# Patient Record
Sex: Female | Born: 1960 | Race: Black or African American | Hispanic: No | Marital: Single | Smoking: Current every day smoker
Health system: Southern US, Community
[De-identification: ages and names within clinical notes are randomized; demographics above are authoritative.]

## PROBLEM LIST (undated history)

## (undated) DIAGNOSIS — K2990 Gastroduodenitis, unspecified, without bleeding: Secondary | ICD-10-CM

## (undated) DIAGNOSIS — E119 Type 2 diabetes mellitus without complications: Secondary | ICD-10-CM

## (undated) DIAGNOSIS — J45909 Unspecified asthma, uncomplicated: Secondary | ICD-10-CM

## (undated) DIAGNOSIS — I10 Essential (primary) hypertension: Secondary | ICD-10-CM

## (undated) DIAGNOSIS — K648 Other hemorrhoids: Secondary | ICD-10-CM

## (undated) DIAGNOSIS — K219 Gastro-esophageal reflux disease without esophagitis: Secondary | ICD-10-CM

## (undated) HISTORY — DX: Gastro-esophageal reflux disease without esophagitis: K21.9

## (undated) HISTORY — PX: EYE SURGERY: SHX253

## (undated) HISTORY — PX: MULTIPLE TOOTH EXTRACTIONS: SHX2053

## (undated) HISTORY — DX: Other hemorrhoids: K64.8

## (undated) HISTORY — DX: Unspecified asthma, uncomplicated: J45.909

## (undated) HISTORY — DX: Gastroduodenitis, unspecified, without bleeding: K29.90

## (undated) HISTORY — DX: Essential (primary) hypertension: I10

## (undated) HISTORY — DX: Type 2 diabetes mellitus without complications: E11.9

---

## 2004-10-20 HISTORY — PX: ESOPHAGOGASTRODUODENOSCOPY: SHX1529

## 2005-05-14 ENCOUNTER — Ambulatory Visit (HOSPITAL_COMMUNITY): Admission: RE | Admit: 2005-05-14 | Discharge: 2005-05-14 | Payer: Self-pay | Admitting: Obstetrics & Gynecology

## 2005-10-20 HISTORY — PX: COLONOSCOPY: SHX174

## 2013-07-25 ENCOUNTER — Encounter: Payer: Self-pay | Admitting: Family Medicine

## 2015-03-20 ENCOUNTER — Encounter: Payer: Self-pay | Admitting: Gastroenterology

## 2015-04-09 ENCOUNTER — Ambulatory Visit: Payer: Self-pay | Admitting: Gastroenterology

## 2015-04-11 ENCOUNTER — Encounter: Payer: Self-pay | Admitting: Gastroenterology

## 2015-04-11 ENCOUNTER — Ambulatory Visit (INDEPENDENT_AMBULATORY_CARE_PROVIDER_SITE_OTHER): Payer: Medicaid Other | Admitting: Gastroenterology

## 2015-04-11 VITALS — BP 121/88 | HR 96 | Temp 97.1°F | Ht 59.0 in | Wt 144.2 lb

## 2015-04-11 DIAGNOSIS — K219 Gastro-esophageal reflux disease without esophagitis: Secondary | ICD-10-CM | POA: Diagnosis not present

## 2015-04-11 DIAGNOSIS — Z1211 Encounter for screening for malignant neoplasm of colon: Secondary | ICD-10-CM | POA: Diagnosis not present

## 2015-04-11 MED ORDER — ONDANSETRON HCL 4 MG PO TABS: 4.0000 mg | ORAL_TABLET | Freq: Three times a day (TID) | ORAL | Status: DC | PRN

## 2015-04-11 MED ORDER — PANTOPRAZOLE SODIUM 40 MG PO TBEC: 40.0000 mg | DELAYED_RELEASE_TABLET | Freq: Every day | ORAL | Status: DC

## 2015-04-11 NOTE — Patient Instructions (Addendum)
Please complete the stool sample and return to our office.   Stop Prilosec. Start Protonix once each morning, 30 minutes before breakfast. I sent this to the pharmacy.  For nausea: take Zofran as needed every 8 hours.   I would like to see you back in 2-4 weeks. You may not need a colonoscopy. You may just need an upper endoscopy if you keep having nausea, vomiting, and reflux despite changing up the medications and your diet. You may need both a colonoscopy and upper endoscopy. We will decide this at your next appointment in a few weeks.

## 2015-04-11 NOTE — Progress Notes (Signed)
Primary Care Physician:  Melynda Keller, NP Primary Gastroenterologist:  Dr. Darrick Penna   Chief Complaint  Patient presents with  . Gastrophageal Reflux  . set up tcs    HPI:   Jessica Kane is a 54 y.o. female presenting today at the request of Smith-Overman, Patty, NP secondary to nausea, vomiting, and possible updated colonoscopy.   Colonoscopy performed in  2007 by Dr. Allena Katz with mild diverticulosis. EGD in 2006 with mild to moderate diffuse gastritis, mild duodenitis.  No blood in stool. Nausea intermittent. Worse with eating. Occasional vomiting. Twice a week. Some mild lower abdominal discomfort, doesn't know if related to BMs or not. No dysphagia. Has significant reflux. Nocturnal reflux. Prilosec once daily, Zantac at night.    Poor historian. Likes fried foods. No NSAIDs.   Past Medical History  Diagnosis Date  . Diabetes   . Hypertension   . GERD (gastroesophageal reflux disease)   . Asthma     Past Surgical History  Procedure Laterality Date  . Colonoscopy  2007    Dr. Allena Katz: mild diverticulosis  . Esophagogastroduodenoscopy  2006    Dr. Allena Katz: mild to moderate diffuse gastritis, mild duodenitis  . Cesarean section      Current Outpatient Prescriptions  Medication Sig Dispense Refill  . Albuterol Sulfate (PROAIR HFA IN) Inhale into the lungs.    Marland Kitchen amLODipine (NORVASC) 5 MG tablet Take 5 mg by mouth daily.    . beclomethasone (QVAR) 80 MCG/ACT inhaler Inhale 2 puffs into the lungs 2 (two) times daily.    . metFORMIN (GLUCOPHAGE) 1000 MG tablet Take 1,000 mg by mouth daily.    Marland Kitchen omeprazole (PRILOSEC) 20 MG capsule Take 20 mg by mouth daily.    . ranitidine (ZANTAC) 150 MG tablet Take 150 mg by mouth at bedtime.    . sucralfate (CARAFATE) 1 G tablet Take 1 g by mouth 4 (four) times daily -  with meals and at bedtime.    . ondansetron (ZOFRAN) 4 MG tablet Take 1 tablet (4 mg total) by mouth every 8 (eight) hours as needed for nausea or vomiting. 30 tablet  1  . pantoprazole (PROTONIX) 40 MG tablet Take 1 tablet (40 mg total) by mouth daily. 30 minutes before breakfast. 30 tablet 3   No current facility-administered medications for this visit.    Allergies as of 04/11/2015  . (No Known Allergies)    Family History  Problem Relation Age of Onset  . Colon cancer Neg Hx     History   Social History  . Marital Status: Single    Spouse Name: N/A  . Number of Children: N/A  . Years of Education: N/A   Occupational History  . Not on file.   Social History Main Topics  . Smoking status: Current Some Day Smoker -- 1.00 packs/day    Types: Cigarettes  . Smokeless tobacco: Not on file  . Alcohol Use: No  . Drug Use: No  . Sexual Activity: Not on file   Other Topics Concern  . Not on file   Social History Narrative    Review of Systems: Gen: Denies any fever, chills, fatigue, weight loss, lack of appetite.  CV: +palpitations Resp: +DOE GI: see HPI GU : Denies urinary burning, urinary frequency, urinary hesitancy MS: Denies joint pain, muscle weakness, cramps, or limitation of movement.  Derm: Denies rash, itching, dry skin Psych: +anxiety  Heme: Denies bruising, bleeding, and enlarged lymph nodes.  Physical Exam: BP 121/88  mmHg  Pulse 96  Temp(Src) 97.1 F (36.2 C)  Ht  (1.499 m)  Wt 144 lb 3.2 oz (65.409 kg)  BMI 29.11 kg/m2 General:   Alert and oriented. Pleasant and cooperative. Well-nourished and well-developed.  Head:  Normocephalic and atraumatic. Eyes:  Without icterus, sclera clear and conjunctiva pink.  Ears:  Normal auditory acuity. Lungs:  Clear to auscultation bilaterally. No wheezes, rales, or rhonchi. No distress.  Heart:  S1, S2 present without murmurs appreciated.  Abdomen:  +BS, soft, mild TTP epigastric and lower abdomen and non-distended. No HSM noted. No guarding or rebound. No masses appreciated.  Rectal:  Deferred  Msk:  Symmetrical without gross deformities. Normal  posture. Extremities:  Without clubbing or edema. Neurologic:  Alert and  oriented x4;  grossly normal neurologically. Psych:  Alert and cooperative. Normal mood and affect.

## 2015-04-14 ENCOUNTER — Encounter: Payer: Self-pay | Admitting: Gastroenterology

## 2015-04-14 DIAGNOSIS — Z1211 Encounter for screening for malignant neoplasm of colon: Secondary | ICD-10-CM | POA: Insufficient documentation

## 2015-04-14 DIAGNOSIS — K219 Gastro-esophageal reflux disease without esophagitis: Secondary | ICD-10-CM | POA: Insufficient documentation

## 2015-04-14 NOTE — Assessment & Plan Note (Signed)
Referred for screening colonoscopy, with last colonoscopy in 2007 by Dr. Allena Katz. No known history of polyps or family history of colon cancer. Denies any concerning lower GI symptoms. Check ifobt. IF positive, proceed with colonoscopy. If negative, would recommend routine screening in 2017. Return in 2-4 weeks.

## 2015-04-14 NOTE — Assessment & Plan Note (Signed)
54 year old female, poor historian, with uncontrolled GERD symptoms despite Prilosec once daily and Zantac in the evening. Likely combination of dietary and behaviors. Associated nausea, occasional vomiting, could be secondary to uncontrolled GERD+/- delayed gastric emptying. EGD in 2006 with gastritis and duodenitis.   Will change Prilosec to Protonix, add Zofran prn, and return in 2-4 weeks. May ultimately need EGD +/- gastric emptying study if persistent issues.

## 2015-04-16 ENCOUNTER — Ambulatory Visit (INDEPENDENT_AMBULATORY_CARE_PROVIDER_SITE_OTHER): Payer: Medicaid Other

## 2015-04-16 DIAGNOSIS — Z1211 Encounter for screening for malignant neoplasm of colon: Secondary | ICD-10-CM

## 2015-04-16 NOTE — Progress Notes (Signed)
Pt returned one iFOBT and it was positive.  

## 2015-04-16 NOTE — Progress Notes (Signed)
cc'd to pcp 

## 2015-05-03 ENCOUNTER — Ambulatory Visit: Payer: Medicaid Other | Admitting: Gastroenterology

## 2015-05-03 NOTE — Progress Notes (Signed)
Pt has appt here on 05/14/2015 with Tobi BastosAnna.

## 2015-05-03 NOTE — Addendum Note (Signed)
Addended by: Nira RetortSAMS, Siona Coulston W on: 05/03/2015 11:56 AM   Modules accepted: Level of Service

## 2015-05-03 NOTE — Progress Notes (Signed)
Noted. Will discuss colonoscopy +/- EGD at time of colonoscopy.

## 2015-05-03 NOTE — Progress Notes (Signed)
//  PT has appt here on with Tobi BastosAnna.

## 2015-05-14 ENCOUNTER — Ambulatory Visit (INDEPENDENT_AMBULATORY_CARE_PROVIDER_SITE_OTHER): Payer: Medicaid Other | Admitting: Gastroenterology

## 2015-05-14 ENCOUNTER — Encounter: Payer: Self-pay | Admitting: Gastroenterology

## 2015-05-14 VITALS — BP 136/80 | HR 88 | Temp 97.6°F | Ht 62.0 in | Wt 145.2 lb

## 2015-05-14 DIAGNOSIS — R195 Other fecal abnormalities: Secondary | ICD-10-CM | POA: Diagnosis not present

## 2015-05-14 DIAGNOSIS — K219 Gastro-esophageal reflux disease without esophagitis: Secondary | ICD-10-CM | POA: Diagnosis not present

## 2015-05-14 NOTE — Patient Instructions (Signed)
We have scheduled you for a colonoscopy and upper endoscopy with Dr. Darrick Penna in the near future.   Continue Protonix once daily and Zofran as needed.

## 2015-05-14 NOTE — Assessment & Plan Note (Signed)
Without overt GI bleeding. Likely benign source. However, last colonoscopy in 2007.   Proceed with colonoscopy with Dr. Darrick Penna in the near future. The risks, benefits, and alternatives have been discussed in detail with the patient. They state understanding and desire to proceed.  Propofol

## 2015-05-14 NOTE — Progress Notes (Signed)
Referring Provider: Melynda Keller, NP Primary GI: Dr. Darrick Penna   Chief Complaint  Patient presents with  . Follow-up    HPI:   Jessica Kane is a 54 y.o. female presenting today with a history of gastritis, last EGD in 2006. Last colonoscopy in 2007 by Dr. Allena Katz with mild diverticulosis. Seen in late June by myself with reports of intermittent nausea, worsened with eating. Occasional vomiting. Significant reflux/nocturnal reflux. Changed from Prilosec to Protonix, added Zofran, with consideration for EGD +/- gastric emptying study if persistent issues. Ifobt also provided: heme positive.   Here to discuss screening colonoscopy +/- EGD. Taking Protonix once daily and Zofran before meals. Still complains of nausea. Vomits twice a week. Last time was after eating pizza. Worse with fried/fatty foods. Lower abdominal pain, vague, noted at night. Not better with defecation. Denies constipation or diarrhea. Persistent nocturnal reflux.   Past Medical History  Diagnosis Date  . Diabetes   . Hypertension   . GERD (gastroesophageal reflux disease)   . Asthma     Past Surgical History  Procedure Laterality Date  . Colonoscopy  2007    Dr. Allena Katz: mild diverticulosis  . Esophagogastroduodenoscopy  2006    Dr. Allena Katz: mild to moderate diffuse gastritis, mild duodenitis  . Cesarean section      Current Outpatient Prescriptions  Medication Sig Dispense Refill  . Albuterol Sulfate (PROAIR HFA IN) Inhale into the lungs.    Marland Kitchen amLODipine (NORVASC) 5 MG tablet Take 5 mg by mouth daily.    . beclomethasone (QVAR) 80 MCG/ACT inhaler Inhale 2 puffs into the lungs 2 (two) times daily.    . meclizine (ANTIVERT) 25 MG tablet Take 25 mg by mouth 3 (three) times daily as needed for dizziness.    . metFORMIN (GLUCOPHAGE) 1000 MG tablet Take 1,000 mg by mouth daily.    . ondansetron (ZOFRAN) 4 MG tablet Take 1 tablet (4 mg total) by mouth every 8 (eight) hours as needed for nausea or vomiting. 30  tablet 1  . pantoprazole (PROTONIX) 40 MG tablet Take 1 tablet (40 mg total) by mouth daily. 30 minutes before breakfast. 30 tablet 3  . ranitidine (ZANTAC) 150 MG tablet Take 150 mg by mouth at bedtime.     No current facility-administered medications for this visit.    Allergies as of 05/14/2015  . (No Known Allergies)    Family History  Problem Relation Age of Onset  . Colon cancer Neg Hx     History   Social History  . Marital Status: Single    Spouse Name: N/A  . Number of Children: N/A  . Years of Education: N/A   Social History Main Topics  . Smoking status: Current Some Day Smoker -- 1.00 packs/day    Types: Cigarettes  . Smokeless tobacco: Not on file  . Alcohol Use: No  . Drug Use: No  . Sexual Activity: Not on file   Other Topics Concern  . None   Social History Narrative    Review of Systems: Gen: see HPI CV: rare chest discomfort  Resp: +DOE GI: see HPI Derm: Denies rash, itching, dry skin Psych: Denies depression, anxiety, memory loss, confusion. No homicidal or suicidal ideation.  Heme: Denies bruising, bleeding, and enlarged lymph nodes.  Physical Exam: BP 136/80 mmHg  Pulse 88  Temp(Src) 97.6 F (36.4 C) (Oral)  Ht  (1.575 m)  Wt 145 lb 3.2 oz (65.862 kg)  BMI 26.55 kg/m2 General:   Alert  and oriented. No distress noted. Difficult to maintain eye contact. Fidgety Head:  Normocephalic and atraumatic. Eyes:  Conjuctiva clear without scleral icterus. Mouth:  Oral mucosa pink and moist.  Heart:  S1, S2 present without murmurs, rubs, or gallops. Regular rate and rhythm. Lungs: scattered rhonchi Abdomen:  +BS, soft, mild TTP lower adbomen and non-distended. No rebound or guarding. No HSM or masses noted. Msk:  Symmetrical without gross deformities. Normal posture. Extremities:  Without edema. Neurologic:  Alert and  oriented x4;  grossly normal neurologically. Skin:  Intact without significant lesions or rashes. Psych:  Alert and  cooperative.

## 2015-05-14 NOTE — Progress Notes (Signed)
REVIEWED-NO ADDITIONAL RECOMMENDATIONS. 

## 2015-05-14 NOTE — Assessment & Plan Note (Signed)
54 year old female, poor historian, with uncontrolled GERD symptoms despite changing from Prilosec to Protonix. Associated nausea and occasional vomiting several times a week. Highly suspect dietary and behavior-related. Unable to exclude delayed gastric emptying. With persistent symptoms and last EGD in 2006, will proceed with EGD at time of colonoscopy.   Proceed with upper endoscopy in the near future with Dr. Darrick Penna. The risks, benefits, and alternatives have been discussed in detail with patient. They have stated understanding and desire to proceed.  Propofol recommended

## 2015-05-15 ENCOUNTER — Other Ambulatory Visit: Payer: Self-pay

## 2015-05-15 MED ORDER — PHOSPHATE LAXATIVE 2.7-7.2 GM/15ML PO SOLN: 15.0000 mL | Freq: Once | ORAL | Status: DC

## 2015-05-15 MED ORDER — PEG-KCL-NACL-NASULF-NA ASC-C 100 G PO SOLR
1.0000 | Freq: Once | ORAL | Status: DC
Start: 2015-05-15 — End: 2015-06-05

## 2015-05-22 NOTE — Progress Notes (Signed)
CC'ED TO PCP 

## 2015-05-29 NOTE — Patient Instructions (Addendum)
Charmain Diosdado Tetreault  05/29/2015     @PREFPERIOPPHARMACY @   Your procedure is scheduled on  06/04/2015  Report to [facility] at  930  A.M.  Call this number if you have problems the morning of surgery:  [phone]   Remember:  Do not eat food or drink liquids after midnight.  Take these medicines the morning of surgery with A SIP OF WATER  Amlodipine, antivert, zofran, protonix.  Use your inhaler    Do not wear jewelry, make-up or nail polish.  Do not wear lotions, powders, or perfumes.    Do not shave 48 hours prior to surgery.  Men may shave face and neck.  Do not bring valuables to the hospital.  [facility] is not responsible for any belongings or valuables.  Contacts, dentures or bridgework may not be worn into surgery.  Leave your suitcase in the car.  After surgery it may be brought to your room.  For patients admitted to the hospital, discharge time will be determined by your treatment team.  Patients discharged the day of surgery will not be allowed to drive home.   Name and phone number of your driver:   family Special instructions:  Follow the prep and diet instructions given to you by Dr. Darrick Penna office.  Please read over the following fact sheets that you were given. Pain Booklet, Coughing and Deep Breathing, Surgical Site Infection Prevention, Anesthesia Post-op Instructions and Care and Recovery After Surgery      Colonoscopy A colonoscopy is an exam to look at the entire large intestine (colon). This exam can help find problems such as tumors, polyps, inflammation, and areas of bleeding. The exam takes about 1 hour.  LET [facility] CARE PROVIDER KNOW ABOUT:   Any allergies you have.  All medicines you are taking, including vitamins, herbs, eye drops, creams, and over-the-counter medicines.  Previous problems you or members of your family have had with the use of anesthetics.  Any blood disorders you have.  Previous surgeries you have had.  Medical  conditions you have. RISKS AND COMPLICATIONS  Generally, this is a safe procedure. However, as with any procedure, complications can occur. Possible complications include:  Bleeding.  Tearing or rupture of the colon wall.  Reaction to medicines given during the exam.  Infection (rare). BEFORE THE PROCEDURE   Ask your health care provider about changing or stopping your regular medicines.  You may be prescribed an oral bowel prep. This involves drinking a large amount of medicated liquid, starting the day before your procedure. The liquid will cause you to have multiple loose stools until your stool is almost clear or light green. This cleans out your colon in preparation for the procedure.  Do not eat or drink anything else once you have started the bowel prep, unless your health care provider tells you it is safe to do so.  Arrange for someone to drive you home after the procedure. PROCEDURE   You will be given medicine to help you relax (sedative).  You will lie on your side with your knees bent.  A Pegues, flexible tube with a light and camera on the end (colonoscope) will be inserted through the rectum and into the colon. The camera sends video back to a computer screen as it moves through the colon. The colonoscope also releases carbon dioxide gas to inflate the colon. This helps your health care provider see the area better.  During the exam, your  health care provider may take a small tissue sample (biopsy) to be examined under a microscope if any abnormalities are found.  The exam is finished when the entire colon has been viewed. AFTER THE PROCEDURE   Do not drive for 24 hours after the exam.  You may have a small amount of blood in your stool.  You may pass moderate amounts of gas and have mild abdominal cramping or bloating. This is caused by the gas used to inflate your colon during the exam.  Ask when your test results will be ready and how you will get your results.  Make sure you get your test results. Document Released: 10/03/2000 Document Revised: 07/27/2013 Document Reviewed: 06/13/2013 [facility] Patient Information 2015 [place], [place]. This information is not intended to replace advice given to you by your health care provider. Make sure you discuss any questions you have with your health care provider. PATIENT INSTRUCTIONS POST-ANESTHESIA  IMMEDIATELY FOLLOWING SURGERY:  Do not drive or operate machinery for the first twenty four hours after surgery.  Do not make any important decisions for twenty four hours after surgery or while taking narcotic pain medications or sedatives.  If you develop intractable nausea and vomiting or a severe headache please notify your doctor immediately.  FOLLOW-UP:  Please make an appointment with your surgeon as instructed. You do not need to follow up with anesthesia unless specifically instructed to do so.  WOUND CARE INSTRUCTIONS (if applicable):  Keep a dry clean dressing on the anesthesia/puncture wound site if there is drainage.  Once the wound has quit draining you may leave it open to air.  Generally you should leave the bandage intact for twenty four hours unless there is drainage.  If the epidural site drains for more than 36-48 hours please call the anesthesia department.  QUESTIONS?:  Please feel free to call your physician or the hospital operator if you have any questions, and they will be happy to assist you.

## 2015-05-30 ENCOUNTER — Other Ambulatory Visit: Payer: Self-pay

## 2015-05-30 ENCOUNTER — Encounter (HOSPITAL_COMMUNITY)
Admission: RE | Admit: 2015-05-30 | Discharge: 2015-05-30 | Disposition: A | Discharge status: Home / Self Care | Payer: Medicaid Other | Source: Ambulatory Visit | Attending: Gastroenterology | Admitting: Gastroenterology

## 2015-05-30 ENCOUNTER — Encounter (HOSPITAL_COMMUNITY): Payer: Self-pay

## 2015-05-30 DIAGNOSIS — Z01818 Encounter for other preprocedural examination: Secondary | ICD-10-CM | POA: Insufficient documentation

## 2015-05-30 DIAGNOSIS — K219 Gastro-esophageal reflux disease without esophagitis: Secondary | ICD-10-CM | POA: Insufficient documentation

## 2015-05-30 DIAGNOSIS — K921 Melena: Secondary | ICD-10-CM | POA: Insufficient documentation

## 2015-05-30 LAB — BASIC METABOLIC PANEL
Anion gap: 9 (ref 5–15)
BUN: 10 mg/dL (ref 6–20)
CALCIUM: 9.4 mg/dL (ref 8.9–10.3)
CO2: 25 mmol/L (ref 22–32)
CREATININE: 0.74 mg/dL (ref 0.44–1.00)
Chloride: 106 mmol/L (ref 101–111)
GFR calc Af Amer: >60 mL/min (ref >60)
GFR calc non Af Amer: >60 mL/min (ref >60)
GLUCOSE: 141 mg/dL — AB (ref 65–99)
Potassium: 4.4 mmol/L (ref 3.5–5.1)
Sodium: 140 mmol/L (ref 135–145)

## 2015-05-30 LAB — CBC WITH DIFFERENTIAL/PLATELET
BASOS ABS: 0 10*3/uL (ref 0.0–0.1)
Basophils Relative: 0 % (ref 0–1)
EOS ABS: 0.1 10*3/uL (ref 0.0–0.7)
Eosinophils Relative: 2 % (ref 0–5)
HEMATOCRIT: 41 % (ref 36.0–46.0)
Hemoglobin: 13.5 g/dL (ref 12.0–15.0)
LYMPHS PCT: 35 % (ref 12–46)
Lymphs Abs: 2.7 10*3/uL (ref 0.7–4.0)
MCH: 27.6 pg (ref 26.0–34.0)
MCHC: 32.9 g/dL (ref 30.0–36.0)
MCV: 83.8 fL (ref 78.0–100.0)
MONO ABS: 0.4 10*3/uL (ref 0.1–1.0)
Monocytes Relative: 5 % (ref 3–12)
Neutro Abs: 4.4 10*3/uL (ref 1.7–7.7)
Neutrophils Relative %: 58 % (ref 43–77)
Platelets: 264 10*3/uL (ref 150–400)
RBC: 4.89 MIL/uL (ref 3.87–5.11)
RDW: 13.4 % (ref 11.5–15.5)
WBC: 7.7 10*3/uL (ref 4.0–10.5)

## 2015-05-30 LAB — HCG, SERUM, QUALITATIVE: PREG SERUM: NEGATIVE

## 2015-06-04 ENCOUNTER — Telehealth: Payer: Self-pay | Admitting: Gastroenterology

## 2015-06-04 ENCOUNTER — Ambulatory Visit (HOSPITAL_COMMUNITY): Payer: Medicaid Other | Admitting: Anesthesiology

## 2015-06-04 ENCOUNTER — Ambulatory Visit (HOSPITAL_COMMUNITY)
Admission: RE | Admit: 2015-06-04 | Discharge: 2015-06-04 | Disposition: A | Discharge status: Home / Self Care | Payer: Medicaid Other | Source: Ambulatory Visit | Attending: Gastroenterology | Admitting: Gastroenterology

## 2015-06-04 ENCOUNTER — Encounter (HOSPITAL_COMMUNITY)
Admission: RE | Disposition: A | Discharge status: Home / Self Care | Payer: Self-pay | Source: Ambulatory Visit | Attending: Gastroenterology

## 2015-06-04 ENCOUNTER — Encounter (HOSPITAL_COMMUNITY): Payer: Self-pay | Admitting: *Deleted

## 2015-06-04 DIAGNOSIS — F17210 Nicotine dependence, cigarettes, uncomplicated: Secondary | ICD-10-CM | POA: Insufficient documentation

## 2015-06-04 DIAGNOSIS — Z79899 Other long term (current) drug therapy: Secondary | ICD-10-CM | POA: Diagnosis not present

## 2015-06-04 DIAGNOSIS — K648 Other hemorrhoids: Secondary | ICD-10-CM | POA: Diagnosis not present

## 2015-06-04 DIAGNOSIS — R1013 Epigastric pain: Secondary | ICD-10-CM | POA: Diagnosis not present

## 2015-06-04 DIAGNOSIS — K219 Gastro-esophageal reflux disease without esophagitis: Secondary | ICD-10-CM | POA: Diagnosis not present

## 2015-06-04 DIAGNOSIS — K2970 Gastritis, unspecified, without bleeding: Secondary | ICD-10-CM

## 2015-06-04 DIAGNOSIS — K649 Unspecified hemorrhoids: Secondary | ICD-10-CM

## 2015-06-04 DIAGNOSIS — K449 Diaphragmatic hernia without obstruction or gangrene: Secondary | ICD-10-CM | POA: Insufficient documentation

## 2015-06-04 DIAGNOSIS — K921 Melena: Secondary | ICD-10-CM

## 2015-06-04 DIAGNOSIS — K2990 Gastroduodenitis, unspecified, without bleeding: Secondary | ICD-10-CM | POA: Diagnosis not present

## 2015-06-04 DIAGNOSIS — E119 Type 2 diabetes mellitus without complications: Secondary | ICD-10-CM | POA: Insufficient documentation

## 2015-06-04 DIAGNOSIS — J45909 Unspecified asthma, uncomplicated: Secondary | ICD-10-CM | POA: Diagnosis not present

## 2015-06-04 DIAGNOSIS — K2980 Duodenitis without bleeding: Secondary | ICD-10-CM | POA: Diagnosis not present

## 2015-06-04 DIAGNOSIS — Q438 Other specified congenital malformations of intestine: Secondary | ICD-10-CM | POA: Insufficient documentation

## 2015-06-04 DIAGNOSIS — I10 Essential (primary) hypertension: Secondary | ICD-10-CM | POA: Diagnosis not present

## 2015-06-04 DIAGNOSIS — K2950 Unspecified chronic gastritis without bleeding: Secondary | ICD-10-CM | POA: Insufficient documentation

## 2015-06-04 HISTORY — PX: BIOPSY: SHX5522

## 2015-06-04 HISTORY — PX: ESOPHAGOGASTRODUODENOSCOPY (EGD) WITH PROPOFOL: SHX5813

## 2015-06-04 HISTORY — PX: COLONOSCOPY WITH PROPOFOL: SHX5780

## 2015-06-04 LAB — GLUCOSE, CAPILLARY
Glucose-Capillary: 143 mg/dL — ABNORMAL HIGH (ref 65–99)
Glucose-Capillary: 88 mg/dL (ref 65–99)

## 2015-06-04 SURGERY — COLONOSCOPY WITH PROPOFOL
Anesthesia: Monitor Anesthesia Care

## 2015-06-04 MED ORDER — LIDOCAINE VISCOUS 2 % MT SOLN
15.0000 mL | Freq: Once | OROMUCOSAL | Status: AC
  Administered 2015-06-04: 15 mL via OROMUCOSAL
  Filled 2015-06-04: qty 15

## 2015-06-04 MED ORDER — LACTATED RINGERS IV SOLN
INTRAVENOUS | Status: DC
  Administered 2015-06-04 (×2): via INTRAVENOUS

## 2015-06-04 MED ORDER — LIDOCAINE HCL (PF) 1 % IJ SOLN
INTRAMUSCULAR | Status: AC
  Filled 2015-06-04: qty 5

## 2015-06-04 MED ORDER — PROPOFOL 10 MG/ML IV BOLUS
INTRAVENOUS | Status: AC
  Filled 2015-06-04: qty 20

## 2015-06-04 MED ORDER — EPHEDRINE SULFATE 50 MG/ML IJ SOLN
INTRAMUSCULAR | Status: DC | PRN
  Administered 2015-06-04: 10 mg via INTRAVENOUS

## 2015-06-04 MED ORDER — FENTANYL CITRATE (PF) 100 MCG/2ML IJ SOLN
INTRAMUSCULAR | Status: AC
  Filled 2015-06-04: qty 2

## 2015-06-04 MED ORDER — PANTOPRAZOLE SODIUM 40 MG PO TBEC: 40.0000 mg | DELAYED_RELEASE_TABLET | Freq: Two times a day (BID) | ORAL | Status: DC

## 2015-06-04 MED ORDER — MIDAZOLAM HCL 2 MG/2ML IJ SOLN
INTRAMUSCULAR | Status: AC
  Filled 2015-06-04: qty 4

## 2015-06-04 MED ORDER — FENTANYL CITRATE (PF) 100 MCG/2ML IJ SOLN
INTRAMUSCULAR | Status: DC | PRN
  Administered 2015-06-04 (×4): 25 ug via INTRAVENOUS

## 2015-06-04 MED ORDER — FENTANYL CITRATE (PF) 100 MCG/2ML IJ SOLN: 25.0000 ug | INTRAMUSCULAR | Status: DC | PRN

## 2015-06-04 MED ORDER — MIDAZOLAM HCL 2 MG/2ML IJ SOLN
INTRAMUSCULAR | Status: AC
  Filled 2015-06-04: qty 2

## 2015-06-04 MED ORDER — LIDOCAINE HCL (CARDIAC) 10 MG/ML IV SOLN
INTRAVENOUS | Status: DC | PRN
  Administered 2015-06-04: 50 mg via INTRAVENOUS

## 2015-06-04 MED ORDER — MIDAZOLAM HCL 5 MG/5ML IJ SOLN
INTRAMUSCULAR | Status: DC | PRN
  Administered 2015-06-04 (×2): 1 mg via INTRAVENOUS

## 2015-06-04 MED ORDER — MIDAZOLAM HCL 2 MG/2ML IJ SOLN
1.0000 mg | INTRAMUSCULAR | Status: DC | PRN
  Administered 2015-06-04: 2 mg via INTRAVENOUS

## 2015-06-04 MED ORDER — PROPOFOL INFUSION 10 MG/ML OPTIME
INTRAVENOUS | Status: DC | PRN
  Administered 2015-06-04: 12:00:00 via INTRAVENOUS
  Administered 2015-06-04: 150 ug/kg/min via INTRAVENOUS

## 2015-06-04 MED ORDER — WATER FOR IRRIGATION, STERILE IR SOLN
Status: DC | PRN
  Administered 2015-06-04: 1000 mL

## 2015-06-04 MED ORDER — ONDANSETRON HCL 4 MG/2ML IJ SOLN
4.0000 mg | Freq: Once | INTRAMUSCULAR | Status: AC
  Administered 2015-06-04: 4 mg via INTRAVENOUS
  Filled 2015-06-04: qty 2

## 2015-06-04 MED ORDER — FENTANYL CITRATE (PF) 100 MCG/2ML IJ SOLN
25.0000 ug | INTRAMUSCULAR | Status: AC
  Administered 2015-06-04 (×2): 25 ug via INTRAVENOUS

## 2015-06-04 MED ORDER — FENTANYL CITRATE (PF) 100 MCG/2ML IJ SOLN
INTRAMUSCULAR | Status: AC
  Filled 2015-06-04: qty 4

## 2015-06-04 MED ORDER — FLEET ENEMA 7-19 GM/118ML RE ENEM
1.0000 | ENEMA | Freq: Once | RECTAL | Status: AC
  Administered 2015-06-04: 1 via RECTAL

## 2015-06-04 MED ORDER — ONDANSETRON HCL 4 MG/2ML IJ SOLN: 4.0000 mg | Freq: Once | INTRAMUSCULAR | Status: DC | PRN

## 2015-06-04 SURGICAL SUPPLY — 24 items
BLOCK BITE 60FR ADLT L/F BLUE (MISCELLANEOUS) ×3 IMPLANT
ELECT REM PT RETURN 9FT ADLT (ELECTROSURGICAL)
ELECTRODE REM PT RTRN 9FT ADLT (ELECTROSURGICAL) IMPLANT
FCP BXJMBJMB 240X2.8X (CUTTING FORCEPS)
FLOOR PAD 36X40 (MISCELLANEOUS) ×3
FORCEPS BIOP RAD 4 LRG CAP 4 (CUTTING FORCEPS) IMPLANT
FORCEPS BIOP RJ4 240 W/NDL (CUTTING FORCEPS)
FORCEPS BXJMBJMB 240X2.8X (CUTTING FORCEPS) IMPLANT
FORMALIN 10 PREFIL 20ML (MISCELLANEOUS) ×3 IMPLANT
INJECTOR/SNARE I SNARE (MISCELLANEOUS) IMPLANT
KIT ENDO PROCEDURE PEN (KITS) ×3 IMPLANT
MANIFOLD NEPTUNE II (INSTRUMENTS) ×3 IMPLANT
NEEDLE SCLEROTHERAPY 25GX240 (NEEDLE) IMPLANT
OVERTUBE ENDOCUFF GREEN (MISCELLANEOUS) ×3 IMPLANT
PAD FLOOR 36X40 (MISCELLANEOUS) ×1 IMPLANT
PROBE APC STR FIRE (PROBE) IMPLANT
PROBE INJECTION GOLD (MISCELLANEOUS)
PROBE INJECTION GOLD 7FR (MISCELLANEOUS) IMPLANT
SNARE SHORT THROW 13M SML OVAL (MISCELLANEOUS) ×3 IMPLANT
SYR 50ML LL SCALE MARK (SYRINGE) ×3 IMPLANT
SYR INFLATION 60ML (SYRINGE) IMPLANT
TRAP SPECIMEN MUCOUS 40CC (MISCELLANEOUS) IMPLANT
TUBING IRRIGATION ENDOGATOR (MISCELLANEOUS) ×3 IMPLANT
WATER STERILE IRR 1000ML POUR (IV SOLUTION) ×3 IMPLANT

## 2015-06-04 NOTE — Anesthesia Procedure Notes (Signed)
Procedure Name: MAC Date/Time: 06/04/2015 11:38 AM Performed by: Pernell Dupre, Brittiney Dicostanzo A Pre-anesthesia Checklist: Patient identified, Timeout performed, Emergency Drugs available, Suction available and Patient being monitored Oxygen Delivery Method: Simple face mask

## 2015-06-04 NOTE — Telephone Encounter (Signed)
PLEASE CALL PT. SHE NEEDS TO SEE DR. KONESWARAN(dX: ABNORMAL EHG AUG 2016) TO DISCUSS HER EKG. IT SUGGESTS SHE MAY HAVE MILD HEART DISEASE.

## 2015-06-04 NOTE — Op Note (Signed)
[facility] [address] [place], [address]   ENDOSCOPY PROCEDURE REPORT  PATIENT: Jessica, Kane  MR#: [id number] BIRTHDATE: 02-Jun-1961 , 54  yrs. old GENDER: female  ENDOSCOPIST: West Bali, MD REFERRED XB:JYNWGNFA Smith-Overman, NP PROCEDURE DATE: June 24, 2015 PROCEDURE:   EGD w/ biopsy INDICATIONS:dyspepsia.   epigastric pain. MEDICATIONS: Monitored anesthesia care TOPICAL ANESTHETIC:   Viscous Xylocaine ASA CLASS:  DESCRIPTION OF PROCEDURE:     Physical exam was performed.  Informed consent was obtained from the patient after explaining the benefits, risks, and alternatives to the procedure.  The patient was connected to the monitor and placed in the left lateral position.  Continuous oxygen was provided by nasal cannula and IV medicine administered through an indwelling cannula.  After administration of sedation, the patients esophagus was intubated and the     endoscope was advanced under direct visualization to the second portion of the duodenum.  The scope was removed slowly by carefully examining the color, texture, anatomy, and integrity of the mucosa on the way out.  The patient was recovered in endoscopy and discharged home in satisfactory condition.  Estimated blood loss is zero unless otherwise noted in this procedure report.      ESOPHAGUS: The mucosa of the esophagus appeared normal.   STOMACH: A small hiatal hernia was noted.   Mild erosive gastritis (inflammation) was found in the gastric antrum and gastric fundus. Multiple biopsies were performed using cold forceps.   DUODENUM: Moderate duodenal inflammation was found in the duodenal bulb. The duodenal mucosa showed no abnormalities in the 2nd part of the duodenum. COMPLICATIONS: There were no immediate complications.  ENDOSCOPIC IMPRESSION: 1.   DYSPEPSIA DUE TO MILD GASTRITIS AND MODERATE DUODENITIS 2.   Small hiatal hernia  RECOMMENDATIONS: STRICTLY AVOID ASPIRIN, BC/GOODY  POWDERS, IBUPROFEN/MOTRIN, OR NAPROXEN/ALEVE FOR 2 WEEKS. INCREASE PROTONIX 30 MINUTES PRIOR TO MEALS TWICE DAILY FOR 3 MOS THE ONCE DAILY. ZANTAC HELPS MOST WHEN USED AS NEEDED. AVOID ITEMS THAT TRIGGER GASTRITIS. FOLLOW A HIGH FIBER/LOW FAT DIET. AWAIT BIOPSY RESULTS. FOLLOW UP IN 4 MOS. Next colonoscopy IN 10 YEARS.  CONSIDER OVERTUBE.  REPEAT EXAM: eSigned:  West Bali, MD 06/24/2015 4:22 PM  CPT CODES: ICD CODES:  The ICD and CPT codes recommended by this software are interpretations from the data that the clinical staff has captured with the software.  The verification of the translation of this report to the ICD and CPT codes and modifiers is the sole responsibility of the health care institution and practicing physician where this report was generated.  [employer] will not be held responsible for the validity of the ICD and CPT codes included on this report.  AMA assumes no liability for data contained or not contained herein. CPT is a Publishing rights manager of the [employer].

## 2015-06-04 NOTE — Progress Notes (Signed)
Patient unable to give herself fleets enema at home. Fleets enema given at this time. Clear return noted.

## 2015-06-04 NOTE — Op Note (Signed)
[facility] [address] [place], [address]   COLONOSCOPY PROCEDURE REPORT  PATIENT: Jessica Kane, Jessica Kane  MR#: [id number] BIRTHDATE: Mar 19, 1961 , 54  yrs. old GENDER: female ENDOSCOPIST: West Bali, MD REFERRED XB:JYNWGNFA Smith-Overman, NP PROCEDURE DATE:  06/18/15 PROCEDURE:   Colonoscopy, diagnostic INDICATIONS:heme-positive stool. MEDICATIONS: Monitored anesthesia care  DESCRIPTION OF PROCEDURE:    Physical exam was performed.  Informed consent was obtained from the patient after explaining the benefits, risks, and alternatives to procedure.  The patient was connected to monitor and placed in left lateral position. Continuous oxygen was provided by nasal cannula and IV medicine administered through an indwelling cannula.  After administration of sedation and rectal exam, the patients rectum was intubated and the     colonoscope was advanced under direct visualization to the cecum.  The scope was removed slowly by carefully examining the color, texture, anatomy, and integrity mucosa on the way out.  The patient was recovered in endoscopy and discharged home in satisfactory condition. Estimated blood loss is zero unless otherwise noted in this procedure report.    COLON FINDINGS: The colon was redundant.  Manual abdominal counter-pressure was used to reach the cecum. Otherwise normal colon-NO POLYPS. Small internal hemorrhoids were found.  PREP QUALITY: good.  CECAL W/D TIME: 13       minutes COMPLICATIONS: None  ENDOSCOPIC IMPRESSION: 1.   The LEFT colon IS redundant 2.   Small internal hemorrhoids 3.   The colonic mucosa appeared normal  RECOMMENDATIONS: STRICTLY AVOID ASPIRIN, BC/GOODY POWDERS, IBUPROFEN/MOTRIN, OR NAPROXEN/ALEVE FOR 2 WEEKS. INCREASE PROTONIX 30 MINUTES PRIOR TO MEALS TWICE DAILY FOR 3 MOS THE ONCE DAILY. ZANTAC HELPS MOST WHEN USED AS NEEDED. AVOID ITEMS THAT TRIGGER GASTRITIS. FOLLOW A HIGH FIBER/LOW FAT DIET. AWAIT BIOPSY  RESULTS. FOLLOW UP IN 4 MOS. Next colonoscopy IN 10 YEARS.   _______________________________ eSignedWest Bali, MD 06-18-2015 4:01 PM    CPT CODES: ICD CODES:  The ICD and CPT codes recommended by this software are interpretations from the data that the clinical staff has captured with the software.  The verification of the translation of this report to the ICD and CPT codes and modifiers is the sole responsibility of the health care institution and practicing physician where this report was generated.  [employer] will not be held responsible for the validity of the ICD and CPT codes included on this report.  AMA assumes no liability for data contained or not contained herein. CPT is a Publishing rights manager of the [employer].

## 2015-06-04 NOTE — H&P (Signed)
Primary Care Physician:  Pcp Not In System Primary Gastroenterologist:  Dr. Oneida Alar  Pre-Procedure History & Physical: HPI:  Jessica Kane is a 54 y.o. female here for HEME POS STOOLS/dyspepsia.  Past Medical History  Diagnosis Date  . Diabetes   . Hypertension   . GERD (gastroesophageal reflux disease)   . Asthma     Past Surgical History  Procedure Laterality Date  . Colonoscopy  2007    Dr. Posey Pronto: mild diverticulosis  . Esophagogastroduodenoscopy  2006    Dr. Posey Pronto: mild to moderate diffuse gastritis, mild duodenitis  . Cesarean section      Prior to Admission medications   Medication Sig Start Date End Date Taking? Authorizing Provider  albuterol (PROVENTIL HFA;VENTOLIN HFA) 108 (90 BASE) MCG/ACT inhaler Inhale 1-2 puffs into the lungs every 6 (six) hours as needed for wheezing or shortness of breath.   Yes Historical Provider, MD  amLODipine (NORVASC) 5 MG tablet Take 5 mg by mouth daily.   Yes Historical Provider, MD  beclomethasone (QVAR) 80 MCG/ACT inhaler Inhale 2 puffs into the lungs 2 (two) times daily.   Yes Historical Provider, MD  meclizine (ANTIVERT) 25 MG tablet Take 25 mg by mouth 3 (three) times daily as needed for dizziness.   Yes Historical Provider, MD  metFORMIN (GLUCOPHAGE) 1000 MG tablet Take 1,000 mg by mouth daily.   Yes Historical Provider, MD  ondansetron (ZOFRAN) 4 MG tablet Take 1 tablet (4 mg total) by mouth every 8 (eight) hours as needed for nausea or vomiting. 04/11/15  Yes Orvil Feil, NP  pantoprazole (PROTONIX) 40 MG tablet Take 1 tablet (40 mg total) by mouth daily. 30 minutes before breakfast. 04/11/15  Yes Orvil Feil, NP  peg 3350 powder (MOVIPREP) 100 G SOLR Take 1 kit (200 g total) by mouth once. 05/15/15 06/14/15 Yes Orvil Feil, NP  phosphate laxative (FLEET) 2.7-7.2 GM/15ML solution Take 15 mLs by mouth once. 05/15/15  Yes Orvil Feil, NP  ranitidine (ZANTAC) 150 MG tablet Take 150 mg by mouth at bedtime.   Yes Historical Provider, MD     Allergies as of 05/15/2015  . (No Known Allergies)    Family History  Problem Relation Age of Onset  . Colon cancer Neg Hx     Social History   Social History  . Marital Status: Single    Spouse Name: N/A  . Number of Children: N/A  . Years of Education: N/A   Occupational History  . Not on file.   Social History Main Topics  . Smoking status: Current Some Day Smoker -- 1.00 packs/day    Types: Cigarettes  . Smokeless tobacco: Not on file  . Alcohol Use: No  . Drug Use: No  . Sexual Activity: Not on file   Other Topics Concern  . Not on file   Social History Narrative    Review of Systems: See HPI, otherwise negative ROS   Physical Exam: BP 105/71 mmHg  Pulse 117  Temp(Src) 98.2 F (36.8 C) (Oral)  Resp 17  Ht $R'5\' 2"'QP$  (1.575 m)  Wt 145 lb (65.772 kg)  BMI 26.51 kg/m2  SpO2 92% General:   Alert,  pleasant and cooperative in NAD Head:  Normocephalic and atraumatic. Neck:  Supple; Lungs:  Clear throughout to auscultation.    Heart:  Regular rate and rhythm. Abdomen:  Soft, nontender and nondistended. Normal bowel sounds, without guarding, and without rebound.   Neurologic:  Alert and  oriented x4;  grossly  normal neurologically.  Impression/Plan:     HEME POS STOOLS/dyspepsia  PLAN:  1.TCS/EGD today

## 2015-06-04 NOTE — Transfer of Care (Signed)
Immediate Anesthesia Transfer of Care Note  Patient: Jessica Kane  Procedure(s) Performed: Procedure(s): COLONOSCOPY WITH PROPOFOL; IN CECUM AT 1152; WITHDRAWAL TIME 18 minutes (N/A) ESOPHAGOGASTRODUODENOSCOPY (EGD) WITH PROPOFOL (N/A) GASTRIC BIOPSIES (N/A)  Patient Location: PACU  Anesthesia Type:MAC  Level of Consciousness: awake, alert , oriented and patient cooperative  Airway & Oxygen Therapy: Patient Spontanous Breathing and Patient connected to face mask oxygen  Post-op Assessment: Report given to RN and Post -op Vital signs reviewed and stable  Post vital signs: Reviewed and stable  Last Vitals:  Filed Vitals:   06/04/15 1233  BP:   Pulse:   Temp: 36.3 C  Resp:     Complications: No apparent anesthesia complications

## 2015-06-04 NOTE — Anesthesia Preprocedure Evaluation (Signed)
Anesthesia Evaluation  Patient identified by MRN, date of birth, ID band Patient awake    Reviewed: Allergy & Precautions, NPO status , Patient's Chart, lab work & pertinent test results  Airway Mallampati: I  TM Distance: >3 FB     Dental  (+) Edentulous Upper, Edentulous Lower   Pulmonary asthma , Current Smoker,  breath sounds clear to auscultation        Cardiovascular hypertension, Pt. on medications Rhythm:Regular Rate:Normal     Neuro/Psych    GI/Hepatic GERD-  Controlled and Medicated,c/o abdominal pain today.   Endo/Other  diabetes, Type 2  Renal/GU      Musculoskeletal   Abdominal   Peds  Hematology   Anesthesia Other Findings   Reproductive/Obstetrics                             Anesthesia Physical Anesthesia Plan  ASA: III  Anesthesia Plan: MAC   Post-op Pain Management:    Induction: Intravenous  Airway Management Planned: Simple Face Mask  Additional Equipment:   Intra-op Plan:   Post-operative Plan: Extubation in OR  Informed Consent: I have reviewed the patients History and Physical, chart, labs and discussed the procedure including the risks, benefits and alternatives for the proposed anesthesia with the patient or authorized representative who has indicated his/her understanding and acceptance.     Plan Discussed with:   Anesthesia Plan Comments:         Anesthesia Quick Evaluation

## 2015-06-04 NOTE — Anesthesia Postprocedure Evaluation (Signed)
  Anesthesia Post-op Note Late Entry  Patient: Jessica Kane  Procedure(s) Performed: Procedure(s): COLONOSCOPY WITH PROPOFOL; IN CECUM AT 1152; WITHDRAWAL TIME 18 minutes (N/A) ESOPHAGOGASTRODUODENOSCOPY (EGD) WITH PROPOFOL (N/A) GASTRIC BIOPSIES (N/A)  Patient Location: PACU  Anesthesia Type:MAC  Level of Consciousness: awake, alert , oriented and patient cooperative  Airway and Oxygen Therapy: Patient Spontanous Breathing  Post-op Pain: none  Post-op Assessment: Post-op Vital signs reviewed, Patient's Cardiovascular Status Stable, Respiratory Function Stable, Patent Airway and No signs of Nausea or vomiting              Post-op Vital Signs: Reviewed and stable  Last Vitals:  Filed Vitals:   06/04/15 1313  BP: 109/76  Pulse: 80  Temp: 36.4 C  Resp: 18    Complications: No apparent anesthesia complications

## 2015-06-04 NOTE — Discharge Instructions (Addendum)
YOU DID NOT HAVE ANY POLYPS. You have small internal hemorrhoids, gastritis, & DUODENITIS. I biopsied your stomach. YOUR UPPER ABDOMINAL PAIN IS MOST LIKELY DUE TO GASTRITIS/DUODENITIS. YOUR HEME POSITIVE STOOLS ARE DUE TO INTERNAL HEMORRHOIDS, GASTRITIS, AND DUODENITIS.    STRICTLY AVOID ASPIRIN, BC/GOODY POWDERS, IBUPROFEN/MOTRIN, OR NAPROXEN/ALEVE FOR 2 WEEKS BECAUSE YOU HAVE A GASTRITIS/DUODENITIS.  INCREASE PROTONIX 30 MINUTES PRIOR TO MEALS TWICE DAILY FOR 3 MOS THE ONCE DAILY.  ZANTAC HELPS MOST WHEN USED AS NEEDED.  AVOID ITEMS THAT TRIGGER GASTRITIS. SEE INFO BELOW.  FOLLOW A HIGH FIBER/LOW FAT DIET. AVOID ITEMS THAT CAUSE BLOATING. SEE INFO BELOW.  YOUR BIOPSY RESULTS WILL BE AVAILABLE IN MY CHART AFTER AUG 17 AND MY OFFICE WILL CONTACT YOU IN 10-14 DAYS WITH YOUR RESULTS.   FOLLOW UP IN 4 MOS.  Next colonoscopy IN 10 YEARS.   ENDOSCOPY Care After Read the instructions outlined below and refer to this sheet in the next week. These discharge instructions provide you with general information on caring for yourself after you leave the hospital. While your treatment has been planned according to the most current medical practices available, unavoidable complications occasionally occur. If you have any problems or questions after discharge, call DR. Cresencia Asmus, [phone].  ACTIVITY  You may resume your regular activity, but move at a slower pace for the next 24 hours.   Take frequent rest periods for the next 24 hours.   Walking will help get rid of the air and reduce the bloated feeling in your belly (abdomen).   No driving for 24 hours (because of the medicine (anesthesia) used during the test).   You may shower.   Do not sign any important legal documents or operate any machinery for 24 hours (because of the anesthesia used during the test).    NUTRITION  Drink plenty of fluids.   You may resume your normal diet as instructed by your doctor.   Begin with a light  meal and progress to your normal diet. Heavy or fried foods are harder to digest and may make you feel sick to your stomach (nauseated).   Avoid alcoholic beverages for 24 hours or as instructed.    MEDICATIONS  You may resume your normal medications.   WHAT YOU CAN EXPECT TODAY  Some feelings of bloating in the abdomen.   Passage of more gas than usual.   Spotting of blood in your stool or on the toilet paper  .  IF YOU HAD POLYPS REMOVED DURING THE ENDOSCOPY:  Eat a soft diet IF YOU HAVE NAUSEA, BLOATING, ABDOMINAL PAIN, OR VOMITING.    FINDING OUT THE RESULTS OF YOUR TEST Not all test results are available during your visit. DR. Darrick Penna WILL CALL YOU WITHIN 14 DAYS OF YOUR PROCEDUE WITH YOUR RESULTS. Do not assume everything is normal if you have not heard from DR. Nichols Corter, CALL HER OFFICE AT [phone].  SEEK IMMEDIATE MEDICAL ATTENTION AND CALL THE OFFICE: [phone] IF:  You have more than a spotting of blood in your stool.   Your belly is swollen (abdominal distention).   You are nauseated or vomiting.   You have a temperature over 101F.   You have abdominal pain or discomfort that is severe or gets worse throughout the day.    Gastritis/DUODENITIS  Gastritis is an inflammation (the body's way of reacting to injury and/or infection) of the stomach. DUODENITIS is an inflammation (the body's way of reacting to injury and/or infection) of the FIRST PART OF THE SMALL  INTESTINES. It is often caused by bacterial (germ) infections. It can also be caused BY ASPIRIN, BC/GOODY POWDER'S, (IBUPROFEN) MOTRIN, OR ALEVE (NAPROXEN), chemicals (including alcohol), SPICY FOODS, and medications. This illness may be associated with generalized malaise (feeling tired, not well), UPPER ABDOMINAL STOMACH cramps, and fever. One common bacterial cause of gastritis is an organism known as H. Pylori. This can be treated with antibiotics.    High-Fiber Diet A high-fiber diet changes your  normal diet to include more whole grains, legumes, fruits, and vegetables. Changes in the diet involve replacing refined carbohydrates with unrefined foods. The calorie level of the diet is essentially unchanged. The Dietary Reference Intake (recommended amount) for adult males is 38 grams per day. For adult females, it is 25 grams per day. Pregnant and lactating women should consume 28 grams of fiber per day. Fiber is the intact part of a plant that is not broken down during digestion. Functional fiber is fiber that has been isolated from the plant to provide a beneficial effect in the body. PURPOSE  Increase stool bulk.   Ease and regulate bowel movements.   Lower cholesterol.  REDUCE RISK OF COLON CANCER  INDICATIONS THAT YOU NEED MORE FIBER  Constipation and hemorrhoids.   Uncomplicated diverticulosis (intestine condition) and irritable bowel syndrome.   Weight management.   As a protective measure against hardening of the arteries (atherosclerosis), diabetes, and cancer.   GUIDELINES FOR INCREASING FIBER IN THE DIET  Start adding fiber to the diet slowly. A gradual increase of about 5 more grams (2 slices of whole-wheat bread, 2 servings of most fruits or vegetables, or 1 bowl of high-fiber cereal) per day is best. Too rapid an increase in fiber may result in constipation, flatulence, and bloating.   Drink enough water and fluids to keep your urine clear or pale yellow. Water, juice, or caffeine-free drinks are recommended. Not drinking enough fluid may cause constipation.   Eat a variety of high-fiber foods rather than one type of fiber.   Try to increase your intake of fiber through using high-fiber foods rather than fiber pills or supplements that contain small amounts of fiber.   The goal is to change the types of food eaten. Do not supplement your present diet with high-fiber foods, but replace foods in your present diet.  INCLUDE A VARIETY OF FIBER SOURCES  Replace  refined and processed grains with whole grains, canned fruits with fresh fruits, and incorporate other fiber sources. White rice, white breads, and most bakery goods contain little or no fiber.   Brown whole-grain rice, buckwheat oats, and many fruits and vegetables are all good sources of fiber. These include: broccoli, Brussels sprouts, cabbage, cauliflower, beets, sweet potatoes, white potatoes (skin on), carrots, tomatoes, eggplant, squash, berries, fresh fruits, and dried fruits.   Cereals appear to be the richest source of fiber. Cereal fiber is found in whole grains and bran. Bran is the fiber-rich outer coat of cereal grain, which is largely removed in refining. In whole-grain cereals, the bran remains. In breakfast cereals, the largest amount of fiber is found in those with "bran" in their names. The fiber content is sometimes indicated on the label.   You may need to include additional fruits and vegetables each day.   In baking, for 1 cup white flour, you may use the following substitutions:   1 cup whole-wheat flour minus 2 tablespoons.   1/2 cup white flour plus 1/2 cup whole-wheat flour.    Low-Fat Diet BREADS, CEREALS,  PASTA, RICE, DRIED PEAS, AND BEANS These products are high in carbohydrates and most are low in fat. Therefore, they can be increased in the diet as substitutes for fatty foods. They too, however, contain calories and should not be eaten in excess. Cereals can be eaten for snacks as well as for breakfast.  Include foods that contain fiber (fruits, vegetables, whole grains, and legumes). Research shows that fiber may lower blood cholesterol levels, especially the water-soluble fiber found in fruits, vegetables, oat products, and legumes. FRUITS AND VEGETABLES It is good to eat fruits and vegetables. Besides being sources of fiber, both are rich in vitamins and some minerals. They help you get the daily allowances of these nutrients. Fruits and vegetables can be used  for snacks and desserts. MEATS Limit lean meat, chicken, [place], and fish to no more than 6 ounces per day. Beef, Pork, and Lamb Use lean cuts of beef, pork, and lamb. Lean cuts include:  Extra-lean ground beef.  Arm roast.  Sirloin tip.  Center-cut ham.  Round steak.  Loin chops.  Rump roast.  Tenderloin.  Trim all fat off the outside of meats before cooking. It is not necessary to severely decrease the intake of red meat, but lean choices should be made. Lean meat is rich in protein and contains a highly absorbable form of iron. Premenopausal women, in particular, should avoid reducing lean red meat because this could increase the risk for low red blood cells (iron-deficiency anemia). The organ meats, such as liver, sweetbreads, kidneys, and brain are very rich in cholesterol. They should be limited. Chicken and [place] These are good sources of protein. The fat of poultry can be reduced by removing the skin and underlying fat layers before cooking. Chicken and [place] can be substituted for lean red meat in the diet. Poultry should not be fried or covered with high-fat sauces. Fish and Shellfish Fish is a good source of protein. Shellfish contain cholesterol, but they usually are low in saturated fatty acids. The preparation of fish is important. Like chicken and [place], they should not be fried or covered with high-fat sauces. EGGS Egg whites contain no fat or cholesterol. They can be eaten often. Try 1 to 2 egg whites instead of whole eggs in recipes or use egg substitutes that do not contain yolk. MILK AND DAIRY PRODUCTS Use skim or 1% milk instead of 2% or whole milk. Decrease whole milk, natural, and processed cheeses. Use nonfat or low-fat (2%) cottage cheese or low-fat cheeses made from vegetable oils. Choose nonfat or low-fat (1 to 2%) yogurt. Experiment with evaporated skim milk in recipes that call for heavy cream. Substitute low-fat yogurt or low-fat cottage cheese for sour cream in  dips and salad dressings. Have at least 2 servings of low-fat dairy products, such as 2 glasses of skim (or 1%) milk each day to help get your daily calcium intake.  FATS AND OILS Reduce the total intake of fats, especially saturated fat. Butterfat, lard, and beef fats are high in saturated fat and cholesterol. These should be avoided as much as possible. Vegetable fats do not contain cholesterol, but certain vegetable fats, such as coconut oil, palm oil, and palm kernel oil are very high in saturated fats. These should be limited. These fats are often used in bakery goods, processed foods, popcorn, oils, and nondairy creamers. Vegetable shortenings and some peanut butters contain hydrogenated oils, which are also saturated fats. Read the labels on these foods and check for saturated vegetable oils.  Unsaturated vegetable oils and fats do not raise blood cholesterol. However, they should be limited because they are fats and are high in calories. Total fat should still be limited to 30% of your daily caloric intake. Desirable liquid vegetable oils are corn oil, cottonseed oil, olive oil, canola oil, safflower oil, soybean oil, and sunflower oil. Peanut oil is not as good, but small amounts are acceptable. Buy a heart-healthy tub margarine that has no partially hydrogenated oils in the ingredients. Mayonnaise and salad dressings often are made from unsaturated fats, but they should also be limited because of their high calorie and fat content. Seeds, nuts, peanut butter, olives, and avocados are high in fat, but the fat is mainly the unsaturated type. These foods should be limited mainly to avoid excess calories and fat. OTHER EATING TIPS Snacks  Most sweets should be limited as snacks. They tend to be rich in calories and fats, and their caloric content outweighs their nutritional value. Some good choices in snacks are graham crackers, melba toast, soda crackers, bagels (no egg), English muffins, fruits, and  vegetables. These snacks are preferable to snack crackers, [place] fries, and chips. Popcorn should be air-popped or cooked in small amounts of liquid vegetable oil. Desserts Eat fruit, low-fat yogurt, and fruit ices. AVOID pastries, cake, and cookies. Sherbet, angel food cake, gelatin dessert, frozen low-fat yogurt, or other frozen products that do not contain saturated fat (pure fruit juice bars, frozen ice pops) are also acceptable.  COOKING METHODS Choose those methods that use little or no fat. They include: Poaching.  Braising.  Steaming.  Grilling.  Baking.  Stir-frying.  Broiling.  Microwaving.  Foods can be cooked in a nonstick pan without added fat, or use a nonfat cooking spray in regular cookware. Limit fried foods and avoid frying in saturated fat. Add moisture to lean meats by using water, broth, cooking wines, and other nonfat or low-fat sauces along with the cooking methods mentioned above. Soups and stews should be chilled after cooking. The fat that forms on top after a few hours in the refrigerator should be skimmed off. When preparing meals, avoid using excess salt. Salt can contribute to raising blood pressure in some people. EATING AWAY FROM HOME Order entres, potatoes, and vegetables without sauces or butter. When meat exceeds the size of a deck of cards (3 to 4 ounces), the rest can be taken home for another meal. Choose vegetable or fruit salads and ask for low-calorie salad dressings to be served on the side. Use dressings sparingly. Limit high-fat toppings, such as bacon, crumbled eggs, cheese, sunflower seeds, and olives. Ask for heart-healthy tub margarine instead of butter.  Hemorrhoids Hemorrhoids are dilated (enlarged) veins around the rectum. Sometimes clots will form in the veins. This makes them swollen and painful. These are called thrombosed hemorrhoids. Causes of hemorrhoids include:  Constipation.   Straining to have a bowel movement.   HEAVY  LIFTING  HOME CARE INSTRUCTIONS  Eat a well balanced diet and drink 6 to 8 glasses of water every day to avoid constipation. You may also use a bulk laxative.   Avoid straining to have bowel movements.   Keep anal area dry and clean.   Do not use a donut shaped pillow or sit on the toilet for [place] periods. This increases blood pooling and pain.   Move your bowels when your body has the urge; this will require less straining and will decrease pain and pressure.

## 2015-06-05 ENCOUNTER — Other Ambulatory Visit: Payer: Self-pay

## 2015-06-05 ENCOUNTER — Encounter (HOSPITAL_COMMUNITY): Payer: Self-pay | Admitting: Gastroenterology

## 2015-06-05 DIAGNOSIS — I519 Heart disease, unspecified: Secondary | ICD-10-CM

## 2015-06-05 NOTE — Telephone Encounter (Signed)
Pt is aware. She said that social service will need to be notified of the referral appt.

## 2015-06-05 NOTE — Telephone Encounter (Signed)
Referral made 

## 2015-06-06 ENCOUNTER — Telehealth: Payer: Self-pay | Admitting: Gastroenterology

## 2015-06-06 NOTE — Telephone Encounter (Signed)
Reminder in epic °

## 2015-06-06 NOTE — Telephone Encounter (Signed)
Please call pt. HER stomach Bx shows gastritis. YOUR UPPER ABDOMINAL PAIN IS MOST LIKELY DUE TO GASTRITIS/DUODENITIS. YOUR HEME POSITIVE STOOLS ARE DUE TO INTERNAL HEMORRHOIDS, GASTRITIS, AND DUODENITIS.    STRICTLY AVOID ASPIRIN, BC/GOODY POWDERS, IBUPROFEN/MOTRIN, OR NAPROXEN/ALEVE FOR 2 WEEKS BECAUSE YOU HAVE A GASTRITIS/DUODENITIS.  INCREASE PROTONIX 30 MINUTES PRIOR TO MEALS TWICE DAILY FOR 3 MOS THE ONCE DAILY.  ZANTAC HELPS MOST WHEN USED AS NEEDED.  AVOID ITEMS THAT TRIGGER GASTRITIS.   FOLLOW A HIGH FIBER/LOW FAT DIET. AVOID ITEMS THAT CAUSE BLOATING.   FOLLOW UP IN 4 MOS E30 GERD/HEME POSITIVE STOOLS.  Next colonoscopy IN 10 YEARS.

## 2015-06-06 NOTE — Telephone Encounter (Signed)
I called and informed pt and she wanted me to telll her son, Malayasia Mirkin , who was there. I gave him the results also.

## 2015-06-15 ENCOUNTER — Ambulatory Visit: Payer: Medicaid Other | Admitting: Cardiovascular Disease

## 2015-06-19 ENCOUNTER — Ambulatory Visit (INDEPENDENT_AMBULATORY_CARE_PROVIDER_SITE_OTHER): Payer: Medicaid Other | Admitting: Cardiology

## 2015-06-19 ENCOUNTER — Encounter: Payer: Self-pay | Admitting: Cardiology

## 2015-06-19 VITALS — BP 151/88 | HR 93 | Ht 62.0 in | Wt 144.0 lb

## 2015-06-19 DIAGNOSIS — I10 Essential (primary) hypertension: Secondary | ICD-10-CM | POA: Diagnosis not present

## 2015-06-19 DIAGNOSIS — R0602 Shortness of breath: Secondary | ICD-10-CM

## 2015-06-19 DIAGNOSIS — K2990 Gastroduodenitis, unspecified, without bleeding: Secondary | ICD-10-CM

## 2015-06-19 DIAGNOSIS — R9431 Abnormal electrocardiogram [ECG] [EKG]: Secondary | ICD-10-CM

## 2015-06-19 DIAGNOSIS — E119 Type 2 diabetes mellitus without complications: Secondary | ICD-10-CM

## 2015-06-19 NOTE — Progress Notes (Signed)
Cardiology Office Note  Date: 06/19/2015   ID: Jessica Kane 1960-11-29, MRN [id number]  PCP: Melynda Keller, NP  Consulting Cardiologist: Nona Dell, MD   Chief Complaint  Patient presents with  . Abnormal ECG    History of Present Illness: Jessica Kane is a 54 y.o. female referred for cardiology consultation by Dr. Jonette Eva. She is here today with a friend. I reviewed the chart, she just recently underwent EGD and colonoscopy for evaluation of heme positive stools. This evaluation revealed gastritis/duodenitis as well as internal hemorrhoids. She has been placed on a high-fiber diet as well as PPI. As part of her preprocedure assessment an ECG was obtained.  I reviewed recent ECGs from August 10. Despite computer interpretations of atrial flutter, possible lateral infarct, and unable to rule out anterior infarct, she does not have definitive findings of any of these. Rhythm is sinus, she does have LVH, and there is decreased anterior R-wave progression which is nonspecific, no clear findings of lateral infarct.  She states that she likes to walk each day in the morning, does report NYHA class II dyspnea, sometimes has an atypical, sharp chest discomfort, but this is not purely exertional. [facility] reviewed below.  Follow-up ECG today again shows sinus rhythm, borderline increased voltage with nonspecific ST changes and also lead motion/tremor artifact.   Past Medical History  Diagnosis Date  . Type 2 diabetes mellitus   . Essential hypertension   . GERD (gastroesophageal reflux disease)   . Asthma   . Internal hemorrhoids   . Gastritis and duodenitis     Past Surgical History  Procedure Laterality Date  . Colonoscopy  2007    Dr. Allena Katz: mild diverticulosis  . Esophagogastroduodenoscopy  2006    Dr. Allena Katz: mild to moderate diffuse gastritis, mild duodenitis  . Cesarean section    . Colonoscopy with  propofol N/A 06/04/2015    Procedure: COLONOSCOPY WITH PROPOFOL; IN CECUM AT 1152; WITHDRAWAL TIME 18 minutes;  Surgeon: West Bali, MD;  Location: [facility];  Service: Endoscopy;  Laterality: N/A;  . Esophagogastroduodenoscopy (egd) with propofol N/A 06/04/2015    Procedure: ESOPHAGOGASTRODUODENOSCOPY (EGD) WITH PROPOFOL;  Surgeon: West Bali, MD;  Location: [facility];  Service: Endoscopy;  Laterality: N/A;  . Esophageal biopsy N/A 06/04/2015    Procedure: GASTRIC BIOPSIES;  Surgeon: West Bali, MD;  Location: [facility];  Service: Endoscopy;  Laterality: N/A;    Current Outpatient Prescriptions  Medication Sig Dispense Refill  . albuterol (PROVENTIL HFA;VENTOLIN HFA) 108 (90 BASE) MCG/ACT inhaler Inhale 1-2 puffs into the lungs every 6 (six) hours as needed for wheezing or shortness of breath.    Marland Kitchen amLODipine (NORVASC) 5 MG tablet Take 5 mg by mouth every morning.     . beclomethasone (QVAR) 80 MCG/ACT inhaler Inhale 2 puffs into the lungs 2 (two) times daily.    . meclizine (ANTIVERT) 25 MG tablet Take 25 mg by mouth 3 (three) times daily as needed for dizziness.    . metFORMIN (GLUCOPHAGE) 1000 MG tablet Take 1,000 mg by mouth every evening.     . ondansetron (ZOFRAN) 4 MG tablet Take 1 tablet (4 mg total) by mouth every 8 (eight) hours as needed for nausea or vomiting. 30 tablet 1  . pantoprazole (PROTONIX) 40 MG tablet Take 1 tablet (40 mg total) by mouth 2 (two) times daily before a meal. 60 tablet 11   No current  facility-administered medications for this visit.    Allergies:  Review of patient's allergies indicates no known allergies.   Social History: The patient  reports that she has been smoking Cigarettes.  She has been smoking about 1.00 pack per day. She does not have any smokeless tobacco history on file. She reports that she does not drink alcohol or use illicit drugs.   Family History: The patient's family history is negative for Colon cancer.   ROS:  Please see the  history of present illness. Otherwise, complete review of systems is positive for reflux mainly at nighttime.  All other systems are reviewed and negative.   Physical Exam: VS:  BP 151/88 mmHg  Pulse 93  Ht  (1.575 m)  Wt 144 lb (65.318 kg)  BMI 26.33 kg/m2  SpO2 98%, BMI Body mass index is 26.33 kg/(m^2).  Wt Readings from Last 3 Encounters:  06/19/15 144 lb (65.318 kg)  06/04/15 145 lb (65.772 kg)  05/30/15 145 lb (65.772 kg)     General: No distress.  HEENT: Conjunctiva and lids normal, oropharynx clear with poor dentition Neck: Supple, no elevated JVP or carotid bruits, no thyromegaly. Lungs: Clear to auscultation, nonlabored breathing at rest. Cardiac: Regular rate and rhythm, no S3, 2/6  systolic murmur, no pericardial rub. Abdomen: Soft, nontender, bowel sounds present. Extremities: No pitting edema, distal pulses 2+. Skin: Warm and dry. Musculoskeletal: No kyphosis. Neuropsychiatric: Alert and oriented x3, affect somewhat guarded. She is fidgety, appears to have tremor or other involuntary movements rest.  ECG: ECG is ordered today.   Recent Labwork: 05/30/2015: BUN 10; Creatinine, Ser 0.74; Hemoglobin 13.5; Platelets 264; Potassium 4.4; Sodium 140   Assessment and Plan:  1. Recent ECG is reviewed, she does have some findings consistent with LVH with otherwise nonspecific changes, no definitive evidence of prior infarct or other arrhythmia. Soft systolic murmur heard on examination as well. Baseline history includes hypertension and type 2 diabetes mellitus. She has had stable dyspnea on exertion and fairly atypical chest discomfort, also noted in the setting of reflux with recent GI workup as outlined. Will plan to obtain an echocardiogram to further assess cardiac structure and function.   2. Essential hypertension, blood pressure is mildly elevated today. She is on Norvasc and [facility].  3. Recent diagnosis of gastritis/duodenitis with  reflux symptoms.  4. Type 2 diabetes mellitus, on Glucophage, [facility].  Current medicines were reviewed with the patient today.   Orders Placed This Encounter  Procedures  . EKG 12-Lead  . ECHOCARDIOGRAM COMPLETE    Disposition: Call with results.  Signed, Jonelle Sidle, MD, [facility] 06/19/2015 9:47 AM    [facility] at [facility] [address] [place], [place], [place] [address] Phone: [phone]; Fax: [phone]

## 2015-06-19 NOTE — Patient Instructions (Signed)

## 2015-07-02 ENCOUNTER — Telehealth: Payer: Self-pay | Admitting: Cardiology

## 2015-07-02 NOTE — Telephone Encounter (Signed)
Echo scheduled 9/14 at [facility]

## 2015-07-04 ENCOUNTER — Other Ambulatory Visit (HOSPITAL_COMMUNITY): Payer: Medicaid Other

## 2015-07-04 ENCOUNTER — Other Ambulatory Visit: Payer: Medicaid Other

## 2015-07-04 ENCOUNTER — Ambulatory Visit (HOSPITAL_COMMUNITY)
Admission: RE | Admit: 2015-07-04 | Discharge: 2015-07-04 | Disposition: A | Discharge status: Home / Self Care | Payer: Medicaid Other | Source: Ambulatory Visit | Attending: Cardiovascular Disease | Admitting: Cardiovascular Disease

## 2015-07-04 DIAGNOSIS — R0609 Other forms of dyspnea: Secondary | ICD-10-CM | POA: Insufficient documentation

## 2015-07-04 DIAGNOSIS — R9431 Abnormal electrocardiogram [ECG] [EKG]: Secondary | ICD-10-CM

## 2015-07-04 DIAGNOSIS — R0602 Shortness of breath: Secondary | ICD-10-CM

## 2015-07-04 DIAGNOSIS — Z720 Tobacco use: Secondary | ICD-10-CM | POA: Diagnosis not present

## 2015-07-04 DIAGNOSIS — I10 Essential (primary) hypertension: Secondary | ICD-10-CM | POA: Diagnosis not present

## 2015-07-04 DIAGNOSIS — E119 Type 2 diabetes mellitus without complications: Secondary | ICD-10-CM | POA: Diagnosis not present

## 2015-10-03 ENCOUNTER — Other Ambulatory Visit: Payer: Self-pay

## 2015-10-03 ENCOUNTER — Encounter: Payer: Self-pay | Admitting: Gastroenterology

## 2015-10-03 ENCOUNTER — Ambulatory Visit (INDEPENDENT_AMBULATORY_CARE_PROVIDER_SITE_OTHER): Payer: Medicaid Other | Admitting: Gastroenterology

## 2015-10-03 VITALS — BP 145/90 | HR 115 | Temp 97.0°F | Ht 62.0 in | Wt 138.2 lb

## 2015-10-03 DIAGNOSIS — R112 Nausea with vomiting, unspecified: Secondary | ICD-10-CM

## 2015-10-03 DIAGNOSIS — R109 Unspecified abdominal pain: Secondary | ICD-10-CM

## 2015-10-03 NOTE — Patient Instructions (Signed)
Please have blood work done today.  We have also scheduled you for a gastric emptying study and ultrasound of your gallbladder.  Further recommendations to follow.

## 2015-10-03 NOTE — Progress Notes (Signed)
Referring Provider: Melynda KellerSmith-Overman, Patty, NP Primary Care Physician:  Melynda KellerSmith-Overman, Patty, NP  Primary GI: Dr. Darrick PennaFields    Chief Complaint  Patient presents with  . Follow-up    HPI:   Jessica Kane is a 54 y.o. female presenting today with a history of GERD, nausea, recently heme positive stool with EGD noting gastritis/duodenitis and colonoscopy with small internal hemorrhoids otherwise no concerning features. She has lost 6 lbs since June 2016.   Has good appetite. Reports vomiting twice a day. Feels nauseated with eating. Feels full off of small amounts of food.  Vague lower abdominal discomfort, stating no association with eating. Tylenol improves discomfort. BM every day. Poor historian, unable to tell me if productive or not.  Sometimes has rare pain in upper abdomen after eating when questioned but did not offer this right away.     Past Medical History  Diagnosis Date  . Type 2 diabetes mellitus (HCC)   . Essential hypertension   . GERD (gastroesophageal reflux disease)   . Asthma   . Internal hemorrhoids   . Gastritis and duodenitis     Past Surgical History  Procedure Laterality Date  . Colonoscopy  2007    Dr. Allena KatzPatel: mild diverticulosis  . Esophagogastroduodenoscopy  2006    Dr. Allena KatzPatel: mild to moderate diffuse gastritis, mild duodenitis  . Cesarean section    . Colonoscopy with propofol N/A 06/04/2015    Dr. Darrick PennaFields: 1. the left colon is redundant 2. Small internal hemorrhoids 3. The  colonici mucosa appeared normal> next colonscopy in 10 years  . Esophagogastroduodenoscopy (egd) with propofol N/A 06/04/2015    Dr. Darrick PennaFields: 1. Dyspepsia due to MILD  gastritis and moderate duodnenits 2. Small hiatal hernia. Negative path for H.pylori   . Esophageal biopsy N/A 06/04/2015    Procedure: GASTRIC BIOPSIES;  Surgeon: West BaliSandi L Fields, MD;  Location: [facility];  Service: Endoscopy;  Laterality: N/A;    Current Outpatient Prescriptions  Medication Sig Dispense Refill  .  albuterol (PROVENTIL HFA;VENTOLIN HFA) 108 (90 BASE) MCG/ACT inhaler Inhale 1-2 puffs into the lungs every 6 (six) hours as needed for wheezing or shortness of breath.    Marland Kitchen. amLODipine (NORVASC) 5 MG tablet Take 5 mg by mouth every morning.     . beclomethasone (QVAR) 80 MCG/ACT inhaler Inhale 2 puffs into the lungs 2 (two) times daily.    . metFORMIN (GLUCOPHAGE) 1000 MG tablet Take 1,000 mg by mouth every evening.     . pantoprazole (PROTONIX) 40 MG tablet Take 1 tablet (40 mg total) by mouth 2 (two) times daily before a meal. 60 tablet 11  . ranitidine (ZANTAC) 150 MG capsule Take 150 mg by mouth 2 (two) times daily.    . meclizine (ANTIVERT) 25 MG tablet Take 25 mg by mouth 3 (three) times daily as needed for dizziness. Reported on 10/03/2015     No current facility-administered medications for this visit.    Allergies as of 10/03/2015  . (No Known Allergies)    Family History  Problem Relation Age of Onset  . Colon cancer Neg Hx     Social History   Social History  . Marital Status: Single    Spouse Name: N/A  . Number of Children: N/A  . Years of Education: N/A   Social History Main Topics  . Smoking status: Current Every Day Smoker -- 1.00 packs/day    Types: Cigarettes  . Smokeless tobacco: None  . Alcohol Use: No  . Drug  Use: No  . Sexual Activity: Not Asked   Other Topics Concern  . None   Social History Narrative    Review of Systems: Limited but negative as above.   Physical Exam: BP 145/90 mmHg  Pulse 115  Temp(Src) 97 F (36.1 C) (Oral)  Ht  (1.575 m)  Wt 138 lb 3.2 oz (62.687 kg)  BMI 25.27 kg/m2 General:   Alert and oriented. No distress noted. Pleasant and cooperative.  Head:  Normocephalic and atraumatic. Eyes:  Conjuctiva clear without scleral icterus. Abdomen:  +BS, soft, non-tender and non-distended. No rebound or guarding. No HSM or masses noted. Msk:  Symmetrical without gross deformities. Normal posture. Extremities:  Without  edema. Psych:  Alert and cooperative. Normal mood and affect.

## 2015-10-04 ENCOUNTER — Ambulatory Visit: Payer: Medicaid Other | Admitting: Gastroenterology

## 2015-10-12 ENCOUNTER — Other Ambulatory Visit (HOSPITAL_COMMUNITY): Payer: Medicaid Other

## 2015-10-16 ENCOUNTER — Encounter: Payer: Self-pay | Admitting: Gastroenterology

## 2015-10-16 NOTE — Assessment & Plan Note (Signed)
Vague abdominal discomfort. Gallbladder remains in situ. Check CMP and proceed with ultrasound of abdomen. May need HIDA. Weight loss noted and likely multifactorial in setting of vomiting. Colonoscopy up-to-date.

## 2015-10-16 NOTE — Progress Notes (Signed)
cc'ed to pcp °

## 2015-10-16 NOTE — Assessment & Plan Note (Signed)
EGD with gastritis/duodenitis. Good appetite but vomiting a few times per day, associated nausea with eating. Early satiety. Concern for gastroparesis. Will arrange GES.

## 2015-10-17 ENCOUNTER — Ambulatory Visit (HOSPITAL_COMMUNITY)
Admission: RE | Admit: 2015-10-17 | Discharge: 2015-10-17 | Disposition: A | Discharge status: Home / Self Care | Payer: Medicaid Other | Source: Ambulatory Visit | Attending: Gastroenterology | Admitting: Gastroenterology

## 2015-10-17 ENCOUNTER — Encounter (HOSPITAL_COMMUNITY)
Admission: RE | Admit: 2015-10-17 | Discharge: 2015-10-17 | Disposition: A | Discharge status: Home / Self Care | Payer: Medicaid Other | Source: Ambulatory Visit | Attending: Gastroenterology | Admitting: Gastroenterology

## 2015-10-17 ENCOUNTER — Encounter (HOSPITAL_COMMUNITY): Payer: Self-pay

## 2015-10-17 DIAGNOSIS — R112 Nausea with vomiting, unspecified: Secondary | ICD-10-CM | POA: Diagnosis present

## 2015-10-17 DIAGNOSIS — R109 Unspecified abdominal pain: Secondary | ICD-10-CM | POA: Insufficient documentation

## 2015-10-17 MED ORDER — TECHNETIUM TC 99M SULFUR COLLOID
2.0000 | Freq: Once | INTRAVENOUS | Status: AC | PRN
  Administered 2015-10-17: 1.9 via ORAL

## 2015-10-20 NOTE — Progress Notes (Signed)
Quick Note:  GES was normal. US abdomen without gallstones. Let's proceed with a HIDA scan. ______

## 2015-10-23 ENCOUNTER — Other Ambulatory Visit: Payer: Self-pay

## 2015-10-23 DIAGNOSIS — R109 Unspecified abdominal pain: Secondary | ICD-10-CM

## 2015-10-23 NOTE — Progress Notes (Signed)
Quick Note:  Tried to call again and phone was busy. Mailing a letter for her to call. Forwarding to [facility] to schedule the HIDA scan. ______

## 2015-10-23 NOTE — Progress Notes (Signed)
Quick Note:  Tried to call and line was busy. ______ 

## 2015-10-29 ENCOUNTER — Ambulatory Visit (HOSPITAL_COMMUNITY): Payer: Medicaid Other

## 2015-11-05 ENCOUNTER — Ambulatory Visit (HOSPITAL_COMMUNITY): Payer: Medicaid Other

## 2015-11-07 ENCOUNTER — Encounter (HOSPITAL_COMMUNITY): Payer: Self-pay

## 2015-11-07 ENCOUNTER — Ambulatory Visit (HOSPITAL_COMMUNITY)
Admission: RE | Admit: 2015-11-07 | Discharge: 2015-11-07 | Disposition: A | Discharge status: Home / Self Care | Payer: Medicaid Other | Source: Ambulatory Visit | Attending: Gastroenterology | Admitting: Gastroenterology

## 2015-11-07 DIAGNOSIS — R109 Unspecified abdominal pain: Secondary | ICD-10-CM | POA: Insufficient documentation

## 2015-11-07 MED ORDER — TECHNETIUM TC 99M MEBROFENIN IV KIT
5.0000 | PACK | Freq: Once | INTRAVENOUS | Status: AC | PRN
  Administered 2015-11-07: 5 via INTRAVENOUS

## 2015-11-12 NOTE — Progress Notes (Signed)
Quick Note:  GES, Ultrasound abdomen, HIDA all normal. Need CMP which was ordered. If persistent abdominal pain, N/V, need CTangiogram. First though, need CMP completed. Needs follow-up in 4-6 weeks. ______

## 2015-11-14 NOTE — Progress Notes (Signed)
Quick Note:  I called and informed pt. She [facility] right after she was here. She is still having abdominal pain and N/V. I will have Darl Pikes check on the lab and forward to [place].   Darl Pikes, please check with [facility] in [place] for the [facility]. Thanks! ______

## 2015-11-20 ENCOUNTER — Encounter: Payer: Self-pay | Admitting: Gastroenterology

## 2015-11-20 NOTE — Progress Notes (Signed)
Quick Note:  Tobi Bastos, did you ever get labs from [facility]? Forwarding to Stacy to make OV in 4-6 weeks. ______

## 2015-11-20 NOTE — Progress Notes (Signed)
APPT MADE AND LETTER SENT  °

## 2015-11-22 ENCOUNTER — Other Ambulatory Visit: Payer: Self-pay

## 2015-11-22 DIAGNOSIS — R748 Abnormal levels of other serum enzymes: Secondary | ICD-10-CM

## 2015-11-22 NOTE — Progress Notes (Signed)
Quick Note:  Got it! I just now saw it on my chair. Thanks! Ok, will scan labs in. LFTs al normal except mildly elevated Alk Phos at 143. Non-specific. Recheck in 3 months.  Let's get a CTA now. I would like to rule out chronic mesenteric ischemia due to pain with eating, N/V, mild weight loss. ______

## 2015-11-22 NOTE — Progress Notes (Signed)
Quick Note:  I still do not have any labs on this patient. I need the CMP. ______

## 2015-11-22 NOTE — Progress Notes (Signed)
Quick Note:  I called and informed pt. She also wanted me to tell her son Maurine Minister who was there and I informed him also.  Lab orders on file for 02/19/2016. Sending to [facility] to schedule the CTA. ______

## 2015-12-24 ENCOUNTER — Ambulatory Visit: Payer: Medicaid Other | Admitting: Gastroenterology

## 2015-12-24 ENCOUNTER — Encounter: Payer: Self-pay | Admitting: Nurse Practitioner

## 2015-12-24 ENCOUNTER — Ambulatory Visit (INDEPENDENT_AMBULATORY_CARE_PROVIDER_SITE_OTHER): Payer: Medicaid Other | Admitting: Nurse Practitioner

## 2015-12-24 VITALS — BP 122/83 | HR 98 | Temp 97.8°F | Ht 59.0 in | Wt 132.0 lb

## 2015-12-24 DIAGNOSIS — R112 Nausea with vomiting, unspecified: Secondary | ICD-10-CM | POA: Diagnosis not present

## 2015-12-24 DIAGNOSIS — R109 Unspecified abdominal pain: Secondary | ICD-10-CM

## 2015-12-24 MED ORDER — ONDANSETRON HCL 4 MG PO TABS: 4.0000 mg | ORAL_TABLET | Freq: Three times a day (TID) | ORAL | Status: DC | PRN

## 2015-12-24 NOTE — Assessment & Plan Note (Addendum)
Patient with continued occasional nausea and vomiting associated with eating. She is taking her PPI twice a day and Zantac twice a day as needed. Continue current medications. CT as noted below. We'll send an Zofran to the pharmacy to help with symptomatic management. Return for follow-up in 6 weeks.

## 2015-12-24 NOTE — Assessment & Plan Note (Signed)
Patient with continued vague abdominal pain of the lower abdomen. Does not appear constipated. GERD symptoms currently well managed on twice a day PPI and Zantac as needed, which she is compliant with. She has had an extensive workup thus far including abdominal ultrasound, HIDA scan, colonoscopy, endoscopy. At this point we'll proceed with a CT scan of her abdomen and pelvis with contrast to further evaluate. Return for follow-up in 6 weeks.

## 2015-12-24 NOTE — Patient Instructions (Addendum)
1. We'll give you to slip [facility]. 2. We will call you to schedule a CT scan. 3. I sent a prescription to your pharmacy for Zofran 4 mg. Take 1 tab every 8 hours if he needed for nausea. 4. Return for follow-up in 6 weeks.

## 2015-12-24 NOTE — Progress Notes (Signed)
cc'ed to pcp °

## 2015-12-24 NOTE — Progress Notes (Signed)
Referring Provider: Melynda KellerSmith-Overman, Patty, NP Primary Care Physician:  Melynda KellerSmith-Overman, Patty, NP Primary GI:  Dr. Darrick PennaFields  Chief Complaint  Patient presents with  . Follow-up    HPI:   Jessica Kane is a 55 y.o. female who presents for abdominal pain, nausea and vomiting. Last seen in our office 10/03/15 for the same. At that time she was nauseated with eating, vomiting twice a day, early satiety, vague lower abdominal pain not associated with eating. Daily bowel movements, occasional upper abdominal pain with eating. At her last visit she was noted to be a poor historian. Was noted recent EGD with gastritis/duodenitis. Noted concern for gastroparesis and possible gallbladder etiology. A gastric emptying study was ordered at that time along with CMP, and abdominal ultrasound. Colonoscopy up-to-date. Ultrasound found no acute findings to explain patient's symptoms. HIDA scan found normal study with an ejection fraction of 52%. Gastric emptying study found 35% residual at 60 minutes and 8% residual at 120 minutes (normal limits include less than 30% at 120 minutes). Essentially normal study. CMP does not appear to have been done.  Today she states her abdominal pain is the same, pain is in the lower abdomen. Pain is sharp, intermittent, occurs daily and lasts 30 mins then goes away. Pain typically occurs at night. Occasional nausea associated with eating. Occasional vomiting, last episode last night. Has a bowel movement daily, occasional straining 2/10 times otherwise soft and pass easily. Denies diarrhea. Denies hematochezia, melena. Denies fever, chills, unintentional weight loss. Denies chest pain, dyspnea, dizziness, lightheadedness, syncope, near syncope. Denies any other upper or lower GI symptoms.  Past Medical History  Diagnosis Date  . Type 2 diabetes mellitus (HCC)   . Essential hypertension   . GERD (gastroesophageal reflux disease)   . Asthma   . Internal hemorrhoids   . Gastritis  and duodenitis     Past Surgical History  Procedure Laterality Date  . Colonoscopy  2007    Dr. Allena KatzPatel: mild diverticulosis  . Esophagogastroduodenoscopy  2006    Dr. Allena KatzPatel: mild to moderate diffuse gastritis, mild duodenitis  . Cesarean section    . Colonoscopy with propofol N/A 06/04/2015    Dr. Darrick PennaFields: 1. the left colon is redundant 2. Small internal hemorrhoids 3. The  colonici mucosa appeared normal> next colonscopy in 10 years  . Esophagogastroduodenoscopy (egd) with propofol N/A 06/04/2015    Dr. Darrick PennaFields: 1. Dyspepsia due to MILD  gastritis and moderate duodenitis 2. Small hiatal hernia. Negative path for H.pylori   . Biopsy N/A 06/04/2015    Procedure: GASTRIC BIOPSIES;  Surgeon: West BaliSandi L Fields, MD;  Location: [facility];  Service: Endoscopy;  Laterality: N/A;    Current Outpatient Prescriptions  Medication Sig Dispense Refill  . albuterol (PROVENTIL HFA;VENTOLIN HFA) 108 (90 BASE) MCG/ACT inhaler Inhale 1-2 puffs into the lungs every 6 (six) hours as needed for wheezing or shortness of breath.    Marland Kitchen. amLODipine (NORVASC) 5 MG tablet Take 5 mg by mouth every morning.     . beclomethasone (QVAR) 80 MCG/ACT inhaler Inhale 2 puffs into the lungs 2 (two) times daily.    . metFORMIN (GLUCOPHAGE) 1000 MG tablet Take 1,000 mg by mouth every evening.     . pantoprazole (PROTONIX) 40 MG tablet Take 1 tablet (40 mg total) by mouth 2 (two) times daily before a meal. 60 tablet 11  . ranitidine (ZANTAC) 150 MG capsule Take 150 mg by mouth 2 (two) times daily.    . meclizine (ANTIVERT) 25  MG tablet Take 25 mg by mouth 3 (three) times daily as needed for dizziness. Reported on 12/24/2015     No current facility-administered medications for this visit.    Allergies as of 12/24/2015  . (No Known Allergies)    Family History  Problem Relation Age of Onset  . Colon cancer Neg Hx     Social History   Social History  . Marital Status: Single    Spouse Name: N/A  . Number of Children: N/A  . Years  of Education: N/A   Social History Main Topics  . Smoking status: Current Every Day Smoker -- 1.00 packs/day    Types: Cigarettes  . Smokeless tobacco: None  . Alcohol Use: No  . Drug Use: No  . Sexual Activity: Not Asked   Other Topics Concern  . None   Social History Narrative    Review of Systems: 10-point ROS negative except as per HPI.   Physical Exam: BP 122/83 mmHg  Pulse 98  Temp(Src) 97.8 F (36.6 C) (Oral)  Ht  (1.499 m)  Wt 132 lb (59.875 kg)  BMI 26.65 kg/m2 General:   Alert and oriented. Pleasant and cooperative. Well-nourished and well-developed.  Eyes:  Without icterus, sclera clear and conjunctiva pink.  Ears:  Normal auditory acuity. Cardiovascular:  S1, S2 present without murmurs appreciated. Extremities without clubbing or edema. Respiratory:  Clear to auscultation bilaterally. No wheezes, rales, or rhonchi. No distress.  Gastrointestinal:  +BS, rounded but soft and non-distended. Moderate lower abdominal TTP. No HSM noted. No rebound. No masses appreciated.  Rectal:  Deferred  Musculoskalatal:  Symmetrical without gross deformities. Skin:  Intact without significant lesions or rashes. Neurologic:  Alert and oriented x4;  grossly normal neurologically. Psych:  Alert and cooperative. Normal mood and affect. Heme/Lymph/Immune:  No excessive bruising noted.    12/24/2015 10:03 AM   Disclaimer: This note was dictated with voice recognition software. Similar sounding words can inadvertently be transcribed and may not be corrected upon review.

## 2015-12-31 ENCOUNTER — Ambulatory Visit (HOSPITAL_COMMUNITY)
Admission: RE | Admit: 2015-12-31 | Discharge: 2015-12-31 | Disposition: A | Discharge status: Home / Self Care | Payer: Medicaid Other | Source: Ambulatory Visit | Attending: Nurse Practitioner | Admitting: Nurse Practitioner

## 2015-12-31 DIAGNOSIS — R109 Unspecified abdominal pain: Secondary | ICD-10-CM | POA: Diagnosis present

## 2015-12-31 DIAGNOSIS — R112 Nausea with vomiting, unspecified: Secondary | ICD-10-CM | POA: Diagnosis not present

## 2015-12-31 DIAGNOSIS — R918 Other nonspecific abnormal finding of lung field: Secondary | ICD-10-CM | POA: Diagnosis not present

## 2015-12-31 LAB — POCT I-STAT CREATININE: Creatinine, Ser: 0.7 mg/dL (ref 0.44–1.00)

## 2015-12-31 MED ORDER — IOHEXOL 300 MG/ML  SOLN
100.0000 mL | Freq: Once | INTRAMUSCULAR | Status: AC | PRN
  Administered 2015-12-31: 100 mL via INTRAVENOUS

## 2016-01-08 NOTE — Progress Notes (Signed)
Quick Note:  PT is aware. Forwarding to Darl PikesSusan to send to PCP. ______

## 2016-01-10 ENCOUNTER — Telehealth: Payer: Self-pay

## 2016-01-10 NOTE — Telephone Encounter (Signed)
PT's son, Rowe RobertDennis Erisman called to see if we could tell him the results of his Mom's CT. She had told them she has a couple of spots on her lung. I called pt and she told me that she had him call and it was ok to tell him. I called him back at [phone] and informed him. He is aware we forwarded the results to her PCP and they should follow up in a year.  Sending an FYI to Wynne DustEric Gill, NP.

## 2016-01-11 NOTE — Telephone Encounter (Signed)
Noted  

## 2016-02-07 ENCOUNTER — Ambulatory Visit: Payer: Medicaid Other | Admitting: Nurse Practitioner

## 2016-02-11 ENCOUNTER — Ambulatory Visit (INDEPENDENT_AMBULATORY_CARE_PROVIDER_SITE_OTHER): Payer: Medicaid Other | Admitting: Nurse Practitioner

## 2016-02-11 ENCOUNTER — Encounter: Payer: Self-pay | Admitting: Nurse Practitioner

## 2016-02-11 VITALS — BP 120/84 | HR 74 | Temp 98.3°F | Ht 60.0 in | Wt 132.0 lb

## 2016-02-11 DIAGNOSIS — R112 Nausea with vomiting, unspecified: Secondary | ICD-10-CM

## 2016-02-11 DIAGNOSIS — R109 Unspecified abdominal pain: Secondary | ICD-10-CM | POA: Diagnosis not present

## 2016-02-11 MED ORDER — SIMETHICONE 80 MG PO CHEW: 80.0000 mg | CHEWABLE_TABLET | Freq: Four times a day (QID) | ORAL | Status: AC | PRN

## 2016-02-11 NOTE — Progress Notes (Signed)
cc'ed to pcp °

## 2016-02-11 NOTE — Progress Notes (Signed)
Referring Provider: Melynda KellerSmith-Overman, Patty, NP Primary Care Physician:  Sharlene DoryJOELIN, VINEETHA, NP Primary GI:  Dr. Darrick PennaFields  Chief Complaint  Patient presents with  . Follow-up  . Abdominal Pain    HPI:   Jessica Kane is a 55 y.o. female who presents For follow-up on abdominal pain. She was last seen in our office 12/24/2015. That point she noted continued nausea and vomiting associated with eating, was on PPI twice a day and Zantac twice a day as needed. Also with abdominal pain which is vague and lower. Colonoscopy up-to-date completed in 2016 which found on the left: Redundant, small internal hemorrhoids, colonic mucosa otherwise normal. Upper endoscopy found mild chronic active gastritis and dietary recommendations in trigger food avoidance was recommended. She had had a extensive workup until that point including abdominal ultrasound, HIDA scan, colonoscopy, endoscopy. CT scan was ordered of the abdomen and pelvis with contrast at her last visit. This is completed 12/31/2015 and found no acute intra-abdominal pathology, 4 mm left lower lobe pulmonary nodule with recommended 1 year repeat if patient at high risk for bronchogenic carcinoma.  Today she states she's still having abdominal pain twice a week. "I'm about the same." When pain starts, lasts 45-50 minutes in her lower abdomen, described as crampy. Some improvement with bowel movement. Noted fluctuance with her abdominal pain. Has a bowel movement twice daily which is consistent with [place] 4 based on her descriptions. No sensation of incomplete emptying. Pain 5-6/10. No more nausea, vomiting, hematochezia, melena. Occasional breakthrough GERD, denies NSAID use and ASA powders. Other than her abdominal pain 1-2 times a week, feels good otherwise. Denies chest pain, dyspnea, dizziness, lightheadedness, syncope, near syncope. Denies any other upper or lower GI symptoms.  Past Medical History  Diagnosis Date  . Type 2 diabetes mellitus (HCC)     . Essential hypertension   . GERD (gastroesophageal reflux disease)   . Asthma   . Internal hemorrhoids   . Gastritis and duodenitis     Past Surgical History  Procedure Laterality Date  . Colonoscopy  2007    Dr. Allena KatzPatel: mild diverticulosis  . Esophagogastroduodenoscopy  2006    Dr. Allena KatzPatel: mild to moderate diffuse gastritis, mild duodenitis  . Cesarean section    . Colonoscopy with propofol N/A 06/04/2015    Dr. Darrick PennaFields: 1. the left colon is redundant 2. Small internal hemorrhoids 3. The  colonici mucosa appeared normal> next colonscopy in 10 years  . Esophagogastroduodenoscopy (egd) with propofol N/A 06/04/2015    Dr. Darrick PennaFields: 1. Dyspepsia due to MILD  gastritis and moderate duodenitis 2. Small hiatal hernia. Negative path for H.pylori   . Biopsy N/A 06/04/2015    Procedure: GASTRIC BIOPSIES;  Surgeon: West BaliSandi L Fields, MD;  Location: [facility];  Service: Endoscopy;  Laterality: N/A;    Current Outpatient Prescriptions  Medication Sig Dispense Refill  . albuterol (PROVENTIL HFA;VENTOLIN HFA) 108 (90 BASE) MCG/ACT inhaler Inhale 1-2 puffs into the lungs every 6 (six) hours as needed for wheezing or shortness of breath.    Marland Kitchen. amLODipine (NORVASC) 5 MG tablet Take 5 mg by mouth every morning.     . beclomethasone (QVAR) 80 MCG/ACT inhaler Inhale 2 puffs into the lungs 2 (two) times daily.    . metFORMIN (GLUCOPHAGE) 1000 MG tablet Take 1,000 mg by mouth every evening.     . pantoprazole (PROTONIX) 40 MG tablet Take 1 tablet (40 mg total) by mouth 2 (two) times daily before a meal. 60 tablet 11  .  ranitidine (ZANTAC) 150 MG capsule Take 150 mg by mouth 2 (two) times daily.     No current facility-administered medications for this visit.    Allergies as of 02/11/2016  . (No Known Allergies)    Family History  Problem Relation Age of Onset  . Colon cancer Neg Hx     Social History   Social History  . Marital Status: Single    Spouse Name: N/A  . Number of Children: N/A  . Years of  Education: N/A   Social History Main Topics  . Smoking status: Current Every Day Smoker -- 1.00 packs/day    Types: Cigarettes  . Smokeless tobacco: Never Used  . Alcohol Use: No  . Drug Use: No  . Sexual Activity: Not Asked   Other Topics Concern  . None   Social History Narrative    Review of Systems: 10-point ROS negative except as per HPI.    Physical Exam: BP 120/84 mmHg  Pulse 74  Temp(Src) 98.3 F (36.8 C)  Ht 5' (1.524 m)  Wt 132 lb (59.875 kg)  BMI 25.78 kg/m2 General:   Alert and oriented. Pleasant and cooperative. Well-nourished and well-developed.  Head:  Normocephalic and atraumatic. Eyes:  Without icterus, sclera clear and conjunctiva pink.  Cardiovascular:  S1, S2 present without murmurs appreciated. Normal pulses noted.  Respiratory:  Clear to auscultation bilaterally. No wheezes, rales, or rhonchi. No distress.  Gastrointestinal:  +BS, soft, and non-distended. Mild generalized TTP. No HSM noted. No guarding or rebound. No masses appreciated.  Rectal:  Deferred  Neurologic:  Alert and oriented x4;  grossly normal neurologically, seems a bit fidgety  today. Psych:  Alert and cooperative. Normal mood and affect.    02/11/2016 9:43 AM   Disclaimer: This note was dictated with voice recognition software. Similar sounding words can inadvertently be transcribed and may not be corrected upon review.

## 2016-02-11 NOTE — Patient Instructions (Signed)
1. Try taking simethicone (Gas-X) as needed for abdominal pain and gas. 2. I sent this to your pharmacy even know it is over-the-counter. You can check which option is cheapest. 3. Return for follow-up in 6-8 weeks.

## 2016-02-11 NOTE — Assessment & Plan Note (Signed)
Continued persistent lower abdominal pain, currently occurring twice a week for 45-60 minutes at a time. No changes in bowel habits or abnormal stools. Does note some gas associated with her abdominal pain. At this point she's had quite an extensive workup here including abdominal ultrasound, HIDA scan, CT of the abdomen/pelvis, endoscopy, colonoscopy. At this point I will have her try over-the-counter simethicone to see if her symptoms are related to gas pain. Return for follow-up in 6-8 weeks at which point if she still not improved she may require tertiary care referral.

## 2016-02-11 NOTE — Assessment & Plan Note (Signed)
Resolved, continue to monitor. Return for follow-up 6-8 weeks.

## 2016-02-13 ENCOUNTER — Other Ambulatory Visit: Payer: Self-pay

## 2016-02-13 DIAGNOSIS — R748 Abnormal levels of other serum enzymes: Secondary | ICD-10-CM

## 2016-04-09 ENCOUNTER — Telehealth: Payer: Self-pay

## 2016-04-09 ENCOUNTER — Other Ambulatory Visit: Payer: Self-pay

## 2016-04-09 ENCOUNTER — Ambulatory Visit (INDEPENDENT_AMBULATORY_CARE_PROVIDER_SITE_OTHER): Payer: Medicaid Other | Admitting: Gastroenterology

## 2016-04-09 ENCOUNTER — Encounter: Payer: Self-pay | Admitting: Gastroenterology

## 2016-04-09 VITALS — BP 135/88 | HR 90 | Temp 97.5°F | Ht 59.0 in | Wt 133.0 lb

## 2016-04-09 DIAGNOSIS — R1033 Periumbilical pain: Secondary | ICD-10-CM

## 2016-04-09 DIAGNOSIS — K219 Gastro-esophageal reflux disease without esophagitis: Secondary | ICD-10-CM | POA: Diagnosis not present

## 2016-04-09 DIAGNOSIS — R1310 Dysphagia, unspecified: Secondary | ICD-10-CM

## 2016-04-09 DIAGNOSIS — G43A0 Cyclical vomiting, not intractable: Secondary | ICD-10-CM | POA: Diagnosis not present

## 2016-04-09 DIAGNOSIS — R1115 Cyclical vomiting syndrome unrelated to migraine: Secondary | ICD-10-CM

## 2016-04-09 NOTE — Telephone Encounter (Signed)
Done

## 2016-04-09 NOTE — Patient Instructions (Addendum)
DRINK WATER TO KEEP YOUR URINE LIGHT YELLOW.  FOLLOW A HIGH FIBER DIET.  AVOID ITEMS THAT CAUSE BLOATING & GAS.  ADD LINZESS 30 MINS PRIOR TO BREAKFAST TO PREVENT DIARRHE. IT MAY CAUSE EXPLOSIVE DIARRHEA.  COMPLETE BARIUM SWALLOW TO ASSESS YOUR SWALLOWING AND VOMITING.   PLEASE CALL WITH QUESTIONS OR CONCERNS.  CHANGE PROTONIX. TAKE 30 MINUTES PRIOR TO BREAKFAST.  USE ZANTAC AS NEEDED.   STOP GAS-X.  FOLLOW UP IN  4 MOS.

## 2016-04-09 NOTE — Telephone Encounter (Signed)
I called the pharmacist and updated the med list.( [phone]) They are doing the dose packs at this time. Please let them know if you want anything taken out, and they are not sure that pt would be able to take prn.

## 2016-04-09 NOTE — Assessment & Plan Note (Signed)
SYMPTOMS FAIRLY WELL CONTROLLED.  CONTINUE PROTONIX. TAKE 30 MINUTES PRIOR TO BREAKFAST. CONTINUE TO MONITOR SYMPTOMS. FOLLOW UP IN 6 MOS.

## 2016-04-09 NOTE — Assessment & Plan Note (Addendum)
ETIOLOGY UNCLEAR-NL HIDA/GES WITHIN THE LAST 6 MOS. DIFFERENTIAL DIAGNOSIS INCLUDES: ESOPHAGEAL MOTILITY DISORDER, PSYCHOGENIC VOMITING, POST-TUSSIVE VOMITING, LESS LIKELY ATYPICAL GERD OR ADRENAL INSUFFICIENCY.   COMPLETE BARIUM SWALLOW TO ASSESS SWALLOWING AND VOMITING.  CALL WITH QUESTIONS OR CONCERNS. CHANGE PROTONIX. TAKE 30 MINUTES PRIOR TO BREAKFAST. USE ZANTAC AS NEEDED.  STOP GAS-X.  FOLLOW UP IN  4 MOS.

## 2016-04-09 NOTE — Progress Notes (Addendum)
Subjective:    Patient ID: [name], female    DOB: [birth year], 55 y.o.   MRN: [id number]  Jessica Kane, VINEETHA, NP  HPI Still having abdominal pain-every day(@NAVEL , TWISTING, NO RADIATION, OFF AND ON, 5-10 MINS, KEEPS HER UP AT NIGHT: 1X/WEEK)). BOWELS MOVE EVERY DAY BUT HAS TROUBLE GETTING IT OUT. HEARTBURN: NO. OCCASIONAL CHEST PAIN: NO TRIGGERS. SOB: INHALERS WORKING. COUGHS FREQUENTLY. FOOD COMES BACK EVERY TIME SHE EATS. Using PROTONIX BID BUT IT DOESN'T HELP. ZANTAC NOT REALLY. GAS-X DOESN;T HELP. "THROWS UP ALL THE TIME". THREW UP CHICKEN NOODLE SOUP. LOST 11 LBS SINCE LAST JUN 2016. WAS TRYING TO LOSE WEIGHT. NAUSEA: NOT REALLY. IF DOESN'T EAT DOESN'T THROW UP IR FEEL SICK AT HER STOMACH. " EATS ALL HER MEALS AT ONCE." GOES DOWN BUT WON'T STAY DOWN. APPETITE: OK. FEELS STOOL GETS THERE BUT CAN'T PUSH IT OUT. HAS ONE SON WHO HELPS HER.   PT DENIES FEVER, CHILLS, HEMATOCHEZIA??, nausea, vomiting, melena, diarrhea, CHEST PAIN, SHORTNESS OF BREATH, CHANGE IN BOWEL IN HABITS, constipation, problems swallowing, problems with sedation, OR heartburn or indigestion.  Past Medical History  Diagnosis Date  . Type 2 diabetes mellitus (HCC)   . Essential hypertension   . GERD (gastroesophageal reflux disease)   . Asthma   . Internal hemorrhoids   . Gastritis and duodenitis    Past Surgical History  Procedure Laterality Date  . Colonoscopy  2007    Dr. Allena KatzPatel: mild diverticulosis  . Esophagogastroduodenoscopy  2006    Dr. Allena KatzPatel: mild to moderate diffuse gastritis, mild duodenitis  . Cesarean section    . Colonoscopy with propofol N/A 06/04/2015    Dr. Darrick PennaFields: 1. the left colon is redundant 2. Small internal hemorrhoids 3. The  colonici mucosa appeared normal> next colonscopy in 10 years  . Esophagogastroduodenoscopy (egd) with propofol N/A 06/04/2015    Dr. Darrick PennaFields: 1. Dyspepsia due to MILD  gastritis and moderate duodenitis 2. Small hiatal hernia. Negative path for H.pylori   . Biopsy N/A  06/04/2015    Procedure: GASTRIC BIOPSIES;  Surgeon: West BaliSandi L Teia Freitas, MD;  Location: [facility];  Service: Endoscopy;  Laterality: N/A;    No Known Allergies  Current Outpatient Prescriptions  Medication Sig Dispense Refill  . albuterol (90 BASE) MCG/ACT inhaler Inhale 1-2 puffs 6H PRN SOB.    Marland Kitchen. amLODipine (NORVASC) 5 MG tablet Take 5 mg by mouth every morning.     . beclomethasone (QVAR) 80 MCG/ACT inhaler Inhale 2 puffs into the lungs 2 (two) times daily.    . metFORMIN (GLUCOPHAGE) 1000 MG tablet Take 1,000 mg by mouth every evening.     . pantoprazole (PROTONIX) 40 MG tablet Take 1 tablet BID before a meal.    . ranitidine (ZANTAC) 150 MG capsule Take 150 mg by mouth 2 (two) times daily.    . simethicone (GAS-X) 80 MG chewable tablet Chew 1 tablet Q6H PRN as needed for flatulence.    TAKES ANOTHER SUGAR PILL???  Review of Systems PER HPI OTHERWISE ALL SYSTEMS ARE NEGATIVE.    Objective:   Physical Exam  Constitutional: She is oriented to person, place, and time. She appears well-developed and well-nourished. No distress.  HENT:  Head: Normocephalic and atraumatic.  Mouth/Throat: Oropharynx is clear and moist. No oropharyngeal exudate.  Eyes: Pupils are equal, round, and reactive to light. No scleral icterus.  Neck: Normal range of motion. Neck supple.  Cardiovascular: Normal rate, regular rhythm and normal heart sounds.   Pulmonary/Chest: Effort normal and breath sounds  normal. No respiratory distress.  Abdominal: Soft. Bowel sounds are normal. She exhibits no distension. There is tenderness. There is no rebound and no guarding.  MILD TTP IN THE EPIGASTRIUM   Musculoskeletal: She exhibits no edema.  Lymphadenopathy:    She has no cervical adenopathy.  Neurological: She is alert and oriented to person, place, and time.  NO  NEW FOCAL DEFICITS  Psychiatric: She has a normal mood and affect.  Vitals reviewed.         Assessment & Plan:

## 2016-04-09 NOTE — Assessment & Plan Note (Addendum)
SYMPTOMS NOT IDEALLY CONTROLLED AND MOST LIKELY DUE TO ABDOMINAL WALL PAIN FROM VOMITING AND/OR CONSTIPATION. EXTENSIVE GI WORKUP UNREMARKABLE. ASSOCIATED WITH INTENTIONAL WEIGHT LOSS.  I PERSONALLY REVIEWED THE CT MAR 2017 WITH DR. Tyron RussellBOLES- 1 VESSEL DISEASE.   DRINK WATER TO KEEP YOUR URINE LIGHT YELLOW. FOLLOW A HIGH FIBER DIET.  AVOID ITEMS THAT CAUSE BLOATING & GAS. ADD LINZESS 30 MINS PRIOR TO BREAKFAST. IT MAY CAUSE EXPLOSIVE DIARRHEA. CALL WITH QUESTIONS OR CONCERNS. CHANGE PROTONIX. TAKE 30 MINUTES PRIOR TO BREAKFAST. USE ZANTAC AS NEEDED.  STOP GAS-X.  FOLLOW UP IN  4 MOS.

## 2016-04-09 NOTE — Progress Notes (Signed)
CC'ED TO PCP 

## 2016-04-09 NOTE — Progress Notes (Signed)
ON RECALL  °

## 2016-04-09 NOTE — Telephone Encounter (Signed)
UPDATE MED LIST IN [facility].

## 2016-04-10 NOTE — Telephone Encounter (Signed)
REVIEWED-NO ADDITIONAL RECOMMENDATIONS. 

## 2016-04-25 ENCOUNTER — Ambulatory Visit (HOSPITAL_COMMUNITY): Payer: Medicaid Other

## 2016-04-28 ENCOUNTER — Ambulatory Visit (HOSPITAL_COMMUNITY): Admission: RE | Admit: 2016-04-28 | Payer: Medicaid Other | Source: Ambulatory Visit

## 2016-05-01 ENCOUNTER — Ambulatory Visit (HOSPITAL_COMMUNITY)
Admission: RE | Admit: 2016-05-01 | Discharge: 2016-05-01 | Disposition: A | Discharge status: Home / Self Care | Payer: Medicaid Other | Source: Ambulatory Visit | Attending: Gastroenterology | Admitting: Gastroenterology

## 2016-05-01 DIAGNOSIS — K449 Diaphragmatic hernia without obstruction or gangrene: Secondary | ICD-10-CM | POA: Insufficient documentation

## 2016-05-01 DIAGNOSIS — R1310 Dysphagia, unspecified: Secondary | ICD-10-CM | POA: Diagnosis present

## 2016-05-09 ENCOUNTER — Telehealth: Payer: Self-pay | Admitting: Gastroenterology

## 2016-05-09 NOTE — Telephone Encounter (Signed)
PLEASE CALL PT. HER SWALLOWING STUDY SHOWS A NORMAL ESOPHAGUS AND REFLUX. HER VOMITING IS MOST LIKELY DUE TO UNCONTROLLED REFLUX.  EAT RIGHT. FOLLOW A LOW FAT DIET. AVOID THINGS THAT TRIGGER REFLUX. CONTINUE YOUR ACID REFLUX MEDICINE. TAKE PROTONIX 30 MINS PRIOR TO MEALS TWICE DAILY. OPV INOCT 2017 E30 VOMITING/GERD.

## 2016-05-09 NOTE — Telephone Encounter (Signed)
ON RECALL  °

## 2016-05-09 NOTE — Telephone Encounter (Signed)
Pt called and asked me to tell her son, Jessica Kane, the results and I did relay the info to him also.

## 2016-05-09 NOTE — Telephone Encounter (Signed)
Pt's mom, Lavinia SharpsMary Gastelum is aware of results.

## 2016-06-19 ENCOUNTER — Encounter: Payer: Self-pay | Admitting: Gastroenterology

## 2016-08-06 ENCOUNTER — Ambulatory Visit (INDEPENDENT_AMBULATORY_CARE_PROVIDER_SITE_OTHER): Payer: Medicaid Other | Admitting: Gastroenterology

## 2016-08-06 ENCOUNTER — Encounter: Payer: Self-pay | Admitting: Gastroenterology

## 2016-08-06 DIAGNOSIS — R1013 Epigastric pain: Secondary | ICD-10-CM | POA: Diagnosis not present

## 2016-08-06 DIAGNOSIS — G43A0 Cyclical vomiting, not intractable: Secondary | ICD-10-CM

## 2016-08-06 DIAGNOSIS — R1115 Cyclical vomiting syndrome unrelated to migraine: Secondary | ICD-10-CM

## 2016-08-06 DIAGNOSIS — K219 Gastro-esophageal reflux disease without esophagitis: Secondary | ICD-10-CM

## 2016-08-06 MED ORDER — PANTOPRAZOLE SODIUM 40 MG PO TBEC: 40.0000 mg | DELAYED_RELEASE_TABLET | Freq: Two times a day (BID) | ORAL | 11 refills | Status: AC

## 2016-08-06 MED ORDER — RANITIDINE HCL 150 MG PO CAPS: ORAL_CAPSULE | ORAL | 11 refills | Status: AC

## 2016-08-06 NOTE — Assessment & Plan Note (Signed)
SYMPTOMS FAIRLY WELL CONTROLLED.   CONTINUE PROTONIX. TAKE 30 MINUTES PRIOR TO MEALS ONCE OR TWICE DAILY.  ZANTAC TWICE DAILY Monday THROUGH FRIDAYAND IF NEEDED ONTHE WEEKENDS.  FOLLOW UP IN 6 MOS.

## 2016-08-06 NOTE — Assessment & Plan Note (Signed)
SYMPTOMS FAIRLY WELL CONTROLLED.  CONTINUE TO MONITOR SYMPTOMS. 

## 2016-08-06 NOTE — Assessment & Plan Note (Signed)
SYMPTOMS FAIRLY WELL CONTROLLED AND MAY BE RELATED TO LACTOSE INTOLERANCE/LOOSE STOOLS/REDUNDANT COLON. PT CONSUMES A LOT OF DAIRY. WEIGHT STABLE AT 132 LBS.  DRINK WATER TO KEEP YOUR URINE LIGHT YELLOW.   EAT FIBER. AVOID ITEMS THAT CAUSE BLOATING & GAS.  FOLLOW UP IN 6 MOS.

## 2016-08-06 NOTE — Progress Notes (Signed)
ON RECALL  °

## 2016-08-06 NOTE — Patient Instructions (Signed)
DRINK WATER TO KEEP YOUR URINE LIGHT YELLOW.   EAT FIBER. AVOID ITEMS THAT CAUSE BLOATING & GAS.  CONTINUE PROTONIX. TAKE 30 MINUTES PRIOR TO MEALS ONCE OR TWICE DAILY.  ZANTAC TWICE DAILY Monday THROUGH FRIDAYAND IF NEEDED ONTHE WEEKENDS.  FOLLOW UP IN 6 MOS. MERRY CHRISTMAS AND HAPPY NEW YEAR!

## 2016-08-06 NOTE — Progress Notes (Signed)
CC'D TO PCP °

## 2016-08-06 NOTE — Progress Notes (Signed)
Subjective:    Patient ID: Jessica Kane, female    DOB: 08/05/1961, 55 y.o.   MRN: [id number]  Sharlene Dory, NP  HPI Appetite: ok. Nausea/vomiting: 1-2x/week(?? Triggers if she eats, no blood). NO HEARTBURN. BMS: 1-2X/DAY-USU A #6. Milk: NONE. CHEESE: A LOT. ICE CREAM: A LOT. BREATHING OK WITH INHALERS AND IN SPITE OF THE SMOKING.UPPER ABDOMINAL PAIN CAN BE ASSOCIATED WITH NAUSEA AND VOMITING. CHEST PAIN: OFF AND ON BUT RARE IN THE MIDDLE. NO ASPIRIN, BC/GOODY POWDERS, IBUPROFEN/MOTRIN, OR NAPROXEN/ALEVE. NO ETOH.   PT DENIES FEVER, CHILLS, HEMATOCHEZIA, melena,  CHANGE IN BOWEL IN HABITS, constipation, abdominal pain, problems swallowing, OR heartburn or indigestion.   Past Medical History:  Diagnosis Date  . Asthma   . Essential hypertension   . Gastritis and duodenitis   . GERD (gastroesophageal reflux disease)   . Internal hemorrhoids   . Type 2 diabetes mellitus (HCC)     Past Surgical History:  Procedure Laterality Date  . BIOPSY N/A 06/04/2015   Procedure: GASTRIC BIOPSIES;  Surgeon: West Bali, MD;  Location: [facility];  Service: Endoscopy;  Laterality: N/A;  . CESAREAN SECTION    . COLONOSCOPY  2007   Dr. Allena Katz: mild diverticulosis  . COLONOSCOPY WITH PROPOFOL N/A 06/04/2015   Dr. Darrick Penna: 1. the left colon is redundant 2. Small internal hemorrhoids 3. The  colonici mucosa appeared normal> next colonscopy in 10 years  . ESOPHAGOGASTRODUODENOSCOPY  2006   Dr. Allena Katz: mild to moderate diffuse gastritis, mild duodenitis  . ESOPHAGOGASTRODUODENOSCOPY (EGD) WITH PROPOFOL N/A 06/04/2015   Dr. Darrick Penna: 1. Dyspepsia due to MILD  gastritis and moderate duodenitis 2. Small hiatal hernia. Negative path for H.pylori    No Known Allergies  Current Outpatient Prescriptions  Medication Sig Dispense Refill  . albuterol (PROVENTIL HFA;VENTOLIN HFA) 108 (90 BASE) MCG/ACT inhaler Inhale 1-2 puffs into the lungs every 6 (six) hours as needed for wheezing or shortness of breath.    Marland Kitchen  amLODipine (NORVASC) 5 MG tablet Take 5 mg by mouth every morning.     . beclomethasone (QVAR) 80 MCG/ACT inhaler Inhale 2 puffs into the lungs 2 (two) times daily.    Marland Kitchen glimepiride (AMARYL) 1 MG tablet Take 1 mg by mouth daily with breakfast.    . Lifitegrast (XIIDRA) 5 % SOLN Apply to eye 2 (two) times daily. One gtt in each eye bid    . metFORMIN (GLUCOPHAGE) 1000 MG tablet Take 1,000 mg by mouth 2 (two) times daily with a meal.     . PROTONIX 40 MG tablet Take BID before a meal.    . ZANTAC 150 MG capsule Take 150 mg BID    .        Review of Systems PER HPI OTHERWISE ALL SYSTEMS ARE NEGATIVE.     Objective:   Physical Exam  Constitutional: She is oriented to person, place, and time. She appears well-developed and well-nourished. No distress.  HENT:  Head: Normocephalic and atraumatic.  Mouth/Throat: Oropharynx is clear and moist. No oropharyngeal exudate.  Eyes: Pupils are equal, round, and reactive to light. No scleral icterus.  Neck: Normal range of motion. Neck supple.  Cardiovascular: Normal rate, regular rhythm and normal heart sounds.   Pulmonary/Chest: Effort normal and breath sounds normal. No respiratory distress.  Abdominal: Soft. Bowel sounds are normal. She exhibits no distension. There is tenderness. There is no rebound and no guarding.  MILD TTP IN THE EPIGASTRIUM AND BUQs  Musculoskeletal: She exhibits no edema.  Lymphadenopathy:  She has no cervical adenopathy.  Neurological: She is alert and oriented to person, place, and time.  NO  NEW FOCAL DEFICITS  Psychiatric: She has a normal mood and affect.  Vitals reviewed.      Assessment & Plan:

## 2016-12-22 ENCOUNTER — Encounter: Payer: Self-pay | Admitting: Gastroenterology

## 2017-01-26 ENCOUNTER — Encounter: Payer: Self-pay | Admitting: Nurse Practitioner

## 2017-01-26 ENCOUNTER — Ambulatory Visit (INDEPENDENT_AMBULATORY_CARE_PROVIDER_SITE_OTHER): Payer: Medicaid Other | Admitting: Nurse Practitioner

## 2017-01-26 VITALS — BP 162/108 | HR 87 | Temp 98.0°F | Ht 64.0 in | Wt 141.6 lb

## 2017-01-26 DIAGNOSIS — G43A0 Cyclical vomiting, not intractable: Secondary | ICD-10-CM | POA: Diagnosis not present

## 2017-01-26 DIAGNOSIS — R030 Elevated blood-pressure reading, without diagnosis of hypertension: Secondary | ICD-10-CM | POA: Diagnosis not present

## 2017-01-26 DIAGNOSIS — K219 Gastro-esophageal reflux disease without esophagitis: Secondary | ICD-10-CM

## 2017-01-26 DIAGNOSIS — R1013 Epigastric pain: Secondary | ICD-10-CM

## 2017-01-26 DIAGNOSIS — R1115 Cyclical vomiting syndrome unrelated to migraine: Secondary | ICD-10-CM

## 2017-01-26 NOTE — Patient Instructions (Signed)
1. Keep taking your current medications. 2. Take your blood pressure medicine as soon as you get home. 3. Reduce the amount of dairy (cheese, ice cream, milk, etc.) that you eat/drink. Try cutting out all dairy for a couple weeks to see if this helps her symptoms. 4. Return for follow-up in 6 months.

## 2017-01-26 NOTE — Progress Notes (Signed)
Referring Provider: Sharlene Dory, NP Primary Care Physician:  Sharlene Dory, NP Primary GI:  Dr. Darrick Penna  Chief Complaint  Patient presents with  . Abdominal Pain  . Nausea  . Emesis    HPI:   Jessica Kane is a 56 y.o. female who presents for abdominal pain, nausea, vomiting. The patient was last seen in our office 08/06/2016 for GERD, epigastric pain, vomiting. At that time noted nausea and vomiting 1-2 times a week, typically after eating, denies heartburn symptoms. Eats a lot of cheese and ice cream. Epigastric pain typically associated with nausea and vomiting. Denied NSAIDs at that time. Symptoms deemed fairly well controlled and possibly related to lactose intolerance or redundant colon. Recommended high-fiber diet, avoid triggers, drink plenty of water. Continue Protonix 1-2 times a day, Zantac twice daily Monday through Friday and if needed on the weekends. Return for follow-up in 6 months.  Colonoscopy up-to-date 06/04/2015 and due for repeat exam in 10 years, upper endoscopy on file 06/04/2015.  Today she states she's feeling about the same. Still eating a lot of lactose products and admits this "is likely why I'm still the same." GERD is about the same, still on daily Protonix. Last episode of emesis was 2 days ago, typically has N/V about once a month. Has some mid abdominal pain, with occurrence about once a month, lasts 10-15 minutes and self resolves. Denies hematochezia, melena. Denies chest pain, dyspnea, dizziness, lightheadedness, syncope, near syncope. Denies any other upper or lower GI symptoms.  BP is significantly elevated to his morning, but patient hasn't taken BP medications yet this morning.   Past Medical History:  Diagnosis Date  . Asthma   . Essential hypertension   . Gastritis and duodenitis   . GERD (gastroesophageal reflux disease)   . Internal hemorrhoids   . Type 2 diabetes mellitus (HCC)     Past Surgical History:  Procedure Laterality  Date  . BIOPSY N/A 06/04/2015   Procedure: GASTRIC BIOPSIES;  Surgeon: West Bali, MD;  Location: [facility];  Service: Endoscopy;  Laterality: N/A;  . CESAREAN SECTION    . COLONOSCOPY  2007   Dr. Allena Katz: mild diverticulosis  . COLONOSCOPY WITH PROPOFOL N/A 06/04/2015   Dr. Darrick Penna: 1. the left colon is redundant 2. Small internal hemorrhoids 3. The  colonici mucosa appeared normal> next colonscopy in 10 years  . ESOPHAGOGASTRODUODENOSCOPY  2006   Dr. Allena Katz: mild to moderate diffuse gastritis, mild duodenitis  . ESOPHAGOGASTRODUODENOSCOPY (EGD) WITH PROPOFOL N/A 06/04/2015   Dr. Darrick Penna: 1. Dyspepsia due to MILD  gastritis and moderate duodenitis 2. Small hiatal hernia. Negative path for H.pylori     Current Outpatient Prescriptions  Medication Sig Dispense Refill  . albuterol (PROVENTIL HFA;VENTOLIN HFA) 108 (90 BASE) MCG/ACT inhaler Inhale 1-2 puffs into the lungs every 6 (six) hours as needed for wheezing or shortness of breath.    Marland Kitchen amLODipine (NORVASC) 5 MG tablet Take 5 mg by mouth every morning.     . beclomethasone (QVAR) 80 MCG/ACT inhaler Inhale 2 puffs into the lungs 2 (two) times daily.    Marland Kitchen glimepiride (AMARYL) 1 MG tablet Take 1 mg by mouth daily with breakfast.    . metFORMIN (GLUCOPHAGE) 1000 MG tablet Take 1,000 mg by mouth 2 (two) times daily with a meal.     . pantoprazole (PROTONIX) 40 MG tablet Take 1 tablet (40 mg total) by mouth 2 (two) times daily before a meal. 60 tablet 11  . ranitidine (ZANTAC)  150 MG capsule 1 PO BID Monday  THRU FRIDAY 60 capsule 11  . simethicone (GAS-X) 80 MG chewable tablet Chew 1 tablet (80 mg total) by mouth every 6 (six) hours as needed for flatulence. 30 tablet 0   No current facility-administered medications for this visit.     Allergies as of 01/26/2017  . (No Known Allergies)    Family History  Problem Relation Age of Onset  . Colon cancer Neg Hx     Social History   Social History  . Marital status: Single    Spouse name:  N/A  . Number of children: N/A  . Years of education: N/A   Social History Main Topics  . Smoking status: Current Every Day Smoker    Packs/day: 1.00    Types: Cigarettes  . Smokeless tobacco: Never Used  . Alcohol use No  . Drug use: No  . Sexual activity: Not Asked   Other Topics Concern  . None   Social History Narrative  . None    Review of Systems: Complete ROS negative except as per HPI.   Physical Exam: BP (!) 161/111   Pulse 87   Temp 98 F (36.7 C) (Oral)   Ht  (1.626 m)   Wt 141 lb 9.6 oz (64.2 kg)   BMI 24.31 kg/m  General:   Alert and oriented. Pleasant and cooperative. Well-nourished and well-developed.  Head:  Normocephalic and atraumatic. Eyes:  Without icterus, sclera clear and conjunctiva pink.  Ears:  Normal auditory acuity. Cardiovascular:  S1, S2 present without murmurs appreciated. Normal pulses noted. Extremities without clubbing or edema. Respiratory:  Clear to auscultation bilaterally. No wheezes, rales, or rhonchi. No distress.  Gastrointestinal:  +BS, soft, and non-distended. Minimal generalized TTP. No HSM noted. No guarding or rebound. No masses appreciated.  Rectal:  Deferred  Musculoskalatal:  Symmetrical without gross deformities. Neurologic:  Alert and oriented x4;  grossly normal neurologically. Psych:  Alert and cooperative. Normal mood and affect. Heme/Lymph/Immune: No excessive bruising noted.    01/26/2017 9:22 AM   Disclaimer: This note was dictated with voice recognition software. Similar sounding words can inadvertently be transcribed and may not be corrected upon review.

## 2017-01-26 NOTE — Assessment & Plan Note (Signed)
GERD symptoms generally well controlled on PPI. Continue current medications and return for follow-up in 6 months.

## 2017-01-26 NOTE — Progress Notes (Signed)
CC'D TO PCP °

## 2017-01-26 NOTE — Assessment & Plan Note (Signed)
Nausea and vomiting less frequent at this time. Typically triggered with abdominal pain. Happening about once a month for 10-15 minutes at a time. Continue to monitor. Again reinforced lactose free diet or at least reducing the amount of lactose products she eats. Return for follow-up in 6 months.

## 2017-01-26 NOTE — Assessment & Plan Note (Signed)
Occasional abdominal pain about once a month for 10-15 minutes at a time and associated with nausea and vomiting. He continues to eat a lot of dairy products. I again recommended reducing consumption of dairy products and eventually lactose-free diet to help her symptoms. She admits her continued symptoms are likely due to eating lots of dairy. Continue current medications. Return for follow-up in 6 months, or sooner if worsening symptoms.

## 2017-01-26 NOTE — Assessment & Plan Note (Signed)
Patient with a significantly elevated blood pressure this morning. States she has not taken her blood pressure medicine yet. I recommend she take this first thing in the morning to help keep her blood pressure under control. I will have the nurse recheck it. She is currently asymptomatic and specifically denies chest pain, dyspnea, dizziness, lightheadedness. Follow-up with primary care for blood pressure management.

## 2017-02-02 NOTE — Progress Notes (Signed)
REVIEWED-NO ADDITIONAL RECOMMENDATIONS. 

## 2017-04-08 ENCOUNTER — Encounter: Payer: Self-pay | Admitting: Nurse Practitioner

## 2017-07-28 ENCOUNTER — Ambulatory Visit: Payer: Medicaid Other | Admitting: Nurse Practitioner

## 2017-07-29 ENCOUNTER — Ambulatory Visit: Payer: Medicaid Other | Admitting: Nurse Practitioner

## 2017-08-05 ENCOUNTER — Ambulatory Visit (INDEPENDENT_AMBULATORY_CARE_PROVIDER_SITE_OTHER): Payer: Medicaid Other | Admitting: Nurse Practitioner

## 2017-08-05 ENCOUNTER — Encounter: Payer: Self-pay | Admitting: Nurse Practitioner

## 2017-08-05 VITALS — BP 151/91 | HR 81 | Temp 96.9°F | Ht 63.0 in | Wt 141.6 lb

## 2017-08-05 DIAGNOSIS — K625 Hemorrhage of anus and rectum: Secondary | ICD-10-CM

## 2017-08-05 DIAGNOSIS — R197 Diarrhea, unspecified: Secondary | ICD-10-CM | POA: Diagnosis not present

## 2017-08-05 DIAGNOSIS — R9389 Abnormal findings on diagnostic imaging of other specified body structures: Secondary | ICD-10-CM | POA: Diagnosis not present

## 2017-08-05 DIAGNOSIS — G43A0 Cyclical vomiting, not intractable: Secondary | ICD-10-CM

## 2017-08-05 DIAGNOSIS — R1115 Cyclical vomiting syndrome unrelated to migraine: Secondary | ICD-10-CM

## 2017-08-05 MED ORDER — DICYCLOMINE HCL 10 MG PO CAPS: 10.0000 mg | ORAL_CAPSULE | Freq: Four times a day (QID) | ORAL | 1 refills | Status: AC | PRN

## 2017-08-05 NOTE — Progress Notes (Signed)
CC'ED TO PCP 

## 2017-08-05 NOTE — Assessment & Plan Note (Signed)
The patient had a CT of the abdomen and pelvis last year that showed pulmonary nodule and recommended follow-up for high-risk patients. The patient does smoke a pack a day. We will send this to her primary care for further follow-up. If they elect chest CT she is about 6 months overdue, based on radiology recommendations.

## 2017-08-05 NOTE — Patient Instructions (Signed)
1. Have your lab tests completed when you're able to. 2. I sent Bentyl 10 mg to your pharmacy. You can take this up to 4 times a day (with meals and at bedtime) if needed for abdominal pain and/or diarrhea. 3. Again, we recommend you avoid dairy. 4. Return for follow-up in 2 months. 5. Call us if you have any questions or concerns.   At [facility] we value your feedback. You may receive a survey about your visit today. Please share your experience as we strive to create trusing relationships with our patients to provide genuine, compassionate, quality care.

## 2017-08-05 NOTE — Assessment & Plan Note (Signed)
The patient states she had rectal bleeding about 3 months ago. She seemed a bit unsure about this. Her colonoscopy was last completed about 2 months ago and normal. Recommend 10 year repeat exam. I will check a CBC today to ensure no significant blood loss. Return for follow-up in 2 months.

## 2017-08-05 NOTE — Assessment & Plan Note (Addendum)
The patient is having nausea and vomiting which is worse today. This occurs about every day. This occurs in addition to her diarrhea with persistent lactose intolerance despite recommendations to the contrary. At this point I will send in a prescription to her pharmacy to help with nausea and vomiting. I again recommended she avoid dairy. Previous gastric emptying test was normal/no gastroparesis.. Continue PPI. Return for follow-up in 2 months. Call us if any worsening symptoms before then.

## 2017-08-05 NOTE — Progress Notes (Signed)
Referring Provider: Sharlene DoryJoelin, Vineetha, NP Primary Care Physician:  Sharlene DoryJoelin, Vineetha, NP Primary GI:  Dr. Darrick PennaFields  Chief Complaint  Patient presents with  . Follow-up    abdominal pain-unchanged    HPI:   Jessica Kane is a 56 y.o. female who presents for follow-up on abdominal pain. The patient was last seen in our office for 07/09/2017 for non-intractable nausea and vomiting, GERD, epigastric pain. Colonoscopy up-to-date 06/04/2015 and due for repeat in 10 years, upper endoscopy on file 06/04/2015 as well.  At her last visit she was feeling "about the same." Still eating a lot of lactose products and admits this is "likely why I'm still the same." GERD stable, daily Protonix. Typically with nausea and vomiting about once a month. Some mid abdominal pain occurring about once a month and last 10-15 minutes and self resolves. No other GI symptoms. Recommended continue current medications, reduce the amount of dairy intake and attempt dairy free diet for a couple weeks to see if this helps. Return for follow-up in 6 months.  CT of the abdomen and pelvis completed 12/31/2015 for similar symptoms found no acute intra-abdominal pathology. 4 mm left lower lobe pulmonary nodule noted in recommended CT at one year if high risk otherwise no follow-up if low risk.  Today she states she's ok overall. Abdominal pain "about the same." States she still consumes dairy products. Pain is intermittent, brief (10-15 minutes), self resolves. Occasional diarrhea about twice a week. States she had hematochezia about 3 months ago. Having N/V almost daily (which is worse). Still on Protonix. Occasional GERD symptoms about once a month. Has daily bowel movement consistent with [place] 4. Has chronic dyspnea, no worse than baseline. Denies chest pain, dizziness, lightheadedness, syncope, near syncope. Denies any other upper or lower GI symptoms.  Past Medical History:  Diagnosis Date  . Asthma   . Essential  hypertension   . Gastritis and duodenitis   . GERD (gastroesophageal reflux disease)   . Internal hemorrhoids   . Type 2 diabetes mellitus (HCC)     Past Surgical History:  Procedure Laterality Date  . BIOPSY N/A 06/04/2015   Procedure: GASTRIC BIOPSIES;  Surgeon: West BaliSandi L Fields, MD;  Location: [facility];  Service: Endoscopy;  Laterality: N/A;  . CESAREAN SECTION    . COLONOSCOPY  2007   Dr. Allena KatzPatel: mild diverticulosis  . COLONOSCOPY WITH PROPOFOL N/A 06/04/2015   Dr. Darrick PennaFields: 1. the left colon is redundant 2. Small internal hemorrhoids 3. The  colonici mucosa appeared normal> next colonscopy in 10 years  . ESOPHAGOGASTRODUODENOSCOPY  2006   Dr. Allena KatzPatel: mild to moderate diffuse gastritis, mild duodenitis  . ESOPHAGOGASTRODUODENOSCOPY (EGD) WITH PROPOFOL N/A 06/04/2015   Dr. Darrick PennaFields: 1. Dyspepsia due to MILD  gastritis and moderate duodenitis 2. Small hiatal hernia. Negative path for H.pylori     Current Outpatient Prescriptions  Medication Sig Dispense Refill  . albuterol (PROVENTIL HFA;VENTOLIN HFA) 108 (90 BASE) MCG/ACT inhaler Inhale 1-2 puffs into the lungs every 6 (six) hours as needed for wheezing or shortness of breath.    Marland Kitchen. amLODipine (NORVASC) 5 MG tablet Take 5 mg by mouth every morning.     . beclomethasone (QVAR) 80 MCG/ACT inhaler Inhale 2 puffs into the lungs 2 (two) times daily.    Marland Kitchen. glimepiride (AMARYL) 1 MG tablet Take 1 mg by mouth daily with breakfast.    . metFORMIN (GLUCOPHAGE) 1000 MG tablet Take 1,000 mg by mouth 2 (two) times daily with a meal.     .  pantoprazole (PROTONIX) 40 MG tablet Take 1 tablet (40 mg total) by mouth 2 (two) times daily before a meal. 60 tablet 11  . ranitidine (ZANTAC) 150 MG capsule 1 PO BID Monday  THRU FRIDAY 60 capsule 11  . simethicone (GAS-X) 80 MG chewable tablet Chew 1 tablet (80 mg total) by mouth every 6 (six) hours as needed for flatulence. 30 tablet 0   No current facility-administered medications for this visit.     Allergies as  of 08/05/2017  . (No Known Allergies)    Family History  Problem Relation Age of Onset  . Colon cancer Neg Hx     Social History   Social History  . Marital status: Single    Spouse name: N/A  . Number of children: N/A  . Years of education: N/A   Social History Main Topics  . Smoking status: Current Every Day Smoker    Packs/day: 1.00    Types: Cigarettes  . Smokeless tobacco: Never Used  . Alcohol use No  . Drug use: No  . Sexual activity: Not Asked   Other Topics Concern  . None   Social History Narrative  . None    Review of Systems: Complete ROS negative except as per HPI.   Physical Exam: BP (!) 151/91   Pulse 81   Temp (!) 96.9 F (36.1 C) (Oral)   Ht 5\' 3"  (1.6 m)   Wt 141 lb 9.6 oz (64.2 kg)   BMI 25.08 kg/m  General:   Alert and oriented. Pleasant and cooperative. Well-nourished and well-developed.  Eyes:  Without icterus, sclera clear and conjunctiva pink.  Ears:  Normal auditory acuity. Throat/Neck:  Supple, without mass or thyromegaly. Cardiovascular:  S1, S2 present without murmurs appreciated. Extremities without clubbing or edema. Respiratory:  Coarse inspiratory wheezes noted bilaterally. Otherwise clear to auscultation. No distress.  Gastrointestinal:  +BS, soft, and non-distended. Mild abdominal TTP. No HSM noted. No guarding or rebound. No masses appreciated.  Rectal:  Deferred  Musculoskalatal:  Symmetrical without gross deformities. Neurologic:  Alert and oriented x4;  grossly normal neurologically. Psych:  Alert and cooperative. Normal mood and affect. Heme/Lymph/Immune: No excessive bruising noted.    08/05/2017 10:19 AM   Disclaimer: This note was dictated with voice recognition software. Similar sounding words can inadvertently be transcribed and may not be corrected upon review.

## 2017-08-05 NOTE — Assessment & Plan Note (Signed)
The patient has persistent diarrhea at least twice a week. Stools otherwise normal. She does continue with persistent dairy intake and I have again recommended she avoid dairy. I will also start her on Bentyl 10 mg 4 times a day as needed for abdominal pain and/or diarrhea. Return for follow-up in 2 months.

## 2017-08-20 ENCOUNTER — Encounter: Payer: Self-pay | Admitting: Gastroenterology

## 2017-08-21 ENCOUNTER — Other Ambulatory Visit (HOSPITAL_COMMUNITY): Payer: Self-pay | Admitting: Family

## 2017-08-21 DIAGNOSIS — R911 Solitary pulmonary nodule: Secondary | ICD-10-CM

## 2017-09-30 ENCOUNTER — Ambulatory Visit: Payer: Medicaid Other | Admitting: Gastroenterology

## 2017-10-07 ENCOUNTER — Encounter: Payer: Self-pay | Admitting: Gastroenterology

## 2017-10-07 ENCOUNTER — Ambulatory Visit: Payer: Medicaid Other | Admitting: Gastroenterology

## 2017-10-07 DIAGNOSIS — R197 Diarrhea, unspecified: Secondary | ICD-10-CM | POA: Diagnosis not present

## 2017-10-07 DIAGNOSIS — K625 Hemorrhage of anus and rectum: Secondary | ICD-10-CM

## 2017-10-07 DIAGNOSIS — R112 Nausea with vomiting, unspecified: Secondary | ICD-10-CM | POA: Diagnosis not present

## 2017-10-07 DIAGNOSIS — K909 Intestinal malabsorption, unspecified: Secondary | ICD-10-CM | POA: Diagnosis not present

## 2017-10-07 DIAGNOSIS — K219 Gastro-esophageal reflux disease without esophagitis: Secondary | ICD-10-CM

## 2017-10-07 NOTE — Assessment & Plan Note (Addendum)
DUE TO IH/CONSTIPATION. SYMPTOMS FAIRLY WELL CONTROLLED. LAST TCS 2016.  CONTINUE TO MONITOR SYMPTOMS. WHEN CONSTIPATED, ADD LINZESS 30 MINS PRIOR TO BREAKFAST. YOU SHOULD HAVE A BM 1-3 HRS AFTER THE DOSE IF NOT TAKE THE LINZESS WITH BREAKFAST. IT CAN CAUSE EXPLOSIVE DIARRHEA. SAMPLES #8 72 MCG GIVEN.

## 2017-10-07 NOTE — Assessment & Plan Note (Signed)
WITH INTERMITTENT CONSTIPATION/NL STOOL AND MOST LIKELY DUE TO LACTOSE INTAKE.  IF YOU CONSUME DAIRY, ADD LACTASE 3 PILLS WITH MEALS UP TO THREE TIMES A DAY. CONTINUE TO MONITOR SYMPTOMS. FOLLOW UP IN 6 MOS.

## 2017-10-07 NOTE — Progress Notes (Signed)
ON RECALL  °

## 2017-10-07 NOTE — Patient Instructions (Signed)
AVOID REFLUX TRIGGERS. SEE INFO BELOW.  TO PREVENT DIARRHEA IF YOU CONSUME DAIRY, ADD LACTASE 3 PILLS WITH MEALS UP TO THREE TIMES A DAY.  TAKE DICYCLOMINE WHEN NEEDED TO CONTROL DIARRHEA. IT MAY CAUSE DROWSINESS, DRY EYES/MOUTH, BLURRY VISION, OR DIFFICULTY URINATING.  IF YOU FEEL CONSTIPATED ADD LINZESS 30 MINS PRIOR TO BREAKFAST. YOU SHOULD HAVE A BM 1-3 HRS AFTER THE DOSE IF NOT TAKE THE LINZESS WITH BREAKFAST. IT CAN CAUSE EXPLOSIVE DIARRHEA.  CONTINUE PROTONIX AND ZANTAC.  FOLLOW UP IN 6 MOS. MERRY CHRISTMAS AND HAPPY NEW YEAR!   Lifestyle and home remedies TO CONTROL HEARTBURN/VOMITING  You may eliminate or reduce the frequency of heartburn by making the following lifestyle changes:  . Control your weight. Being overweight is a major risk factor for heartburn and GERD. Excess pounds put pressure on your abdomen, pushing up your stomach and causing acid to back up into your esophagus.   . Eat smaller meals. 4 TO 6 MEALS A DAY. This reduces pressure on the lower esophageal sphincter, helping to prevent the valve from opening and acid from washing back into your esophagus.   Allena Earing. Loosen your belt. Clothes that fit tightly around your waist put pressure on your abdomen and the lower esophageal sphincter.  .  . Eliminate heartburn triggers. Everyone has specific triggers. Common triggers such as fatty or fried foods, spicy food, tomato sauce, carbonated beverages, alcohol, chocolate, mint, garlic, onion, caffeine and nicotine may make heartburn worse.   Marland Kitchen. Avoid stooping or bending. Tying your shoes is OK. Bending over for longer periods to weed your garden isn't, especially soon after eating.   . Don't lie down after a meal. Wait at least three to four hours after eating before going to bed, and don't lie down right after eating.   Alternative medicine . Several home remedies exist for treating GERD, but they provide only temporary relief. They include drinking baking soda (sodium  bicarbonate) added to water or drinking other fluids such as baking soda mixed with cream of tartar and water. . Although these liquids create temporary relief by neutralizing, washing away or buffering acids, eventually they aggravate the situation by adding gas and fluid to your stomach, increasing pressure and causing more acid reflux. Further, adding more sodium to your diet may increase your blood pressure and add stress to your heart, and excessive bicarbonate ingestion can alter the acid-base balance in your body.

## 2017-10-07 NOTE — Assessment & Plan Note (Signed)
SYMPTOMS FAIRLY WELL CONTROLLED.  AVOID TRIGGERS LOW FAT DIET CONTINUE PROTONIX. TAKE 30 MINUTES PRIOR TO MEALS TWICE DAILY. FOLLOW UP IN 6 MOS.

## 2017-10-07 NOTE — Progress Notes (Signed)
Subjective:    Patient ID: Jessica Kane, female    DOB: Jan 20, 1961, 56 y.o.   MRN: [id number]  HPI Nausea/vomiting q2 days(sml amount, no blood). HEARTBURN: 1X/WEEK. BMs: DAILY, MAY HAVE STRAIN OR PUSH SOMETIMES. FEELS CONSTIPATED SOMETIMES. DIARRHEA: WATERY(A LITTLE BIT, MILK: NO, CHEESE: YES, ICE CREAM: YES). May see blood in stool: 1x/mo. CHEST PAIN: ALMOST EVERY DAY. PAIN IN ABDOMEN: ONCE A MO. APPETITE; GOOD. WEIGHT DOWN 3 LBS SINCE OCT 2018.  PT DENIES FEVER, CHILLS, HEMATEMESIS, melena, diarrhea,  SHORTNESS OF BREATH,  CHANGE IN BOWEL IN HABITS,  OR problems swallowing.  Past Medical History:  Diagnosis Date  . Asthma   . Essential hypertension   . Gastritis and duodenitis   . GERD (gastroesophageal reflux disease)   . Internal hemorrhoids   . Type 2 diabetes mellitus (HCC)    Past Surgical History:  Procedure Laterality Date  . BIOPSY N/A 06/04/2015   Procedure: GASTRIC BIOPSIES;  Surgeon: West BaliSandi L Cherry [place]lington, MD;  Location: [facility];  Service: Endoscopy;  Laterality: N/A;  . CESAREAN SECTION    . COLONOSCOPY  2007   Dr. Allena KatzPatel: mild diverticulosis  . COLONOSCOPY WITH PROPOFOL N/A 06/04/2015   Dr. Darrick PennaFields: 1. the left colon is redundant 2. Small internal hemorrhoids 3. The  colonici mucosa appeared normal> next colonscopy in 10 years  . ESOPHAGOGASTRODUODENOSCOPY  2006   Dr. Allena KatzPatel: mild to moderate diffuse gastritis, mild duodenitis  . ESOPHAGOGASTRODUODENOSCOPY (EGD) WITH PROPOFOL N/A 06/04/2015   Dr. Darrick PennaFields: 1. Dyspepsia due to MILD  gastritis and moderate duodenitis 2. Small hiatal hernia. Negative path for H.pylori    No Known Allergies  Current Outpatient Medications  Medication Sig Dispense Refill  . albuterol (PROVENTIL HFA;VENTOLIN HFA) 108 (90 BASE) MCG/ACT inhaler Inhale 1-2 puffs into the lungs every 6 (six) hours as needed for wheezing or shortness of breath.    Marland Kitchen. amLODipine (NORVASC) 5 MG tablet Take 5 mg by mouth every morning.     . beclomethasone (QVAR) 80 MCG/ACT  inhaler Inhale 2 puffs into the lungs 2 (two) times daily.    . Cholecalciferol (VITAMIN D) 2000 units CAPS Take 1 capsule by mouth daily.    Marland Kitchen. dicyclomine (BENTYL) 10 MG capsule Take 1 capsule (10 mg total) by mouth 4 (four) times daily as needed for spasms. 1-2X/MO   . glimepiride (AMARYL) 1 MG tablet Take 1 mg by mouth daily with breakfast.    . metFORMIN (GLUCOPHAGE) 1000 MG tablet Take 1,000 mg by mouth 2 (two) times daily with a meal.     . pantoprazole (PROTONIX) 40 MG tablet Take 1 tablet (40 mg total) by mouth 2 (two) times daily before a meal.    . ranitidine (ZANTAC) 150 MG capsule 1 PO BID Monday  THRU FRIDAY    . simethicone (GAS-X) 80 MG chewable tablet Chew 1 tablet (80 mg total) by mouth every 6 (six) hours as needed for flatulence.     Review of Systems PER HPI OTHERWISE ALL SYSTEMS ARE NEGATIVE.    Objective:   Physical Exam  Constitutional: She is oriented to person, place, and time. She appears well-developed and well-nourished. No distress.  HENT:  Head: Normocephalic and atraumatic.  Mouth/Throat: Oropharynx is clear and moist. No oropharyngeal exudate.  Eyes: Pupils are equal, round, and reactive to light. No scleral icterus.  Neck: Normal range of motion. Neck supple.  Cardiovascular: Normal rate, regular rhythm and normal heart sounds.  Pulmonary/Chest: Effort normal and breath sounds normal. No respiratory distress.  Abdominal: Soft. Bowel sounds are normal. She exhibits no distension. There is no tenderness.  Musculoskeletal: She exhibits no edema.  Lymphadenopathy:    She has no cervical adenopathy.  Neurological: She is alert and oriented to person, place, and time.  Psychiatric: She has a normal mood and affect.  Vitals reviewed.     Assessment & Plan:

## 2017-10-07 NOTE — Progress Notes (Signed)
cc'd to pcp 

## 2017-10-07 NOTE — Assessment & Plan Note (Signed)
SYMPTOMS FAIRLY WELL CONTROLLED AND MOST LIKELY DUE TO REGURGITATION OR REFLUX. LAST EGD 2016.  CONTINUE TO MONITOR SYMPTOMS. AVOID REFLUX TRIGGER LOW FAT DIET FOLLOW UP IN 6 MOS.

## 2018-02-17 ENCOUNTER — Encounter: Payer: Self-pay | Admitting: Gastroenterology

## 2018-07-05 ENCOUNTER — Other Ambulatory Visit: Payer: Self-pay | Admitting: Neurology

## 2018-07-05 DIAGNOSIS — R269 Unspecified abnormalities of gait and mobility: Secondary | ICD-10-CM

## 2018-07-09 ENCOUNTER — Ambulatory Visit (HOSPITAL_COMMUNITY)
Admission: RE | Admit: 2018-07-09 | Discharge: 2018-07-09 | Disposition: A | Discharge status: Home / Self Care | Payer: Medicaid Other | Source: Ambulatory Visit | Attending: Neurology | Admitting: Neurology

## 2018-07-09 DIAGNOSIS — R260 Ataxic gait: Secondary | ICD-10-CM | POA: Diagnosis not present

## 2018-07-09 DIAGNOSIS — G9389 Other specified disorders of brain: Secondary | ICD-10-CM | POA: Diagnosis not present

## 2018-07-09 DIAGNOSIS — R269 Unspecified abnormalities of gait and mobility: Secondary | ICD-10-CM

## 2018-11-01 ENCOUNTER — Other Ambulatory Visit (HOSPITAL_COMMUNITY): Payer: Self-pay | Admitting: Neurology

## 2018-11-01 ENCOUNTER — Other Ambulatory Visit: Payer: Self-pay | Admitting: Neurology

## 2018-11-01 DIAGNOSIS — R269 Unspecified abnormalities of gait and mobility: Secondary | ICD-10-CM

## 2018-11-01 DIAGNOSIS — R296 Repeated falls: Secondary | ICD-10-CM

## 2018-11-10 ENCOUNTER — Ambulatory Visit (HOSPITAL_COMMUNITY): Payer: Medicaid Other

## 2018-11-19 ENCOUNTER — Ambulatory Visit (HOSPITAL_COMMUNITY)
Admission: RE | Admit: 2018-11-19 | Discharge: 2018-11-19 | Disposition: A | Discharge status: Home / Self Care | Payer: Medicaid Other | Source: Ambulatory Visit | Attending: Neurology | Admitting: Neurology

## 2018-11-19 DIAGNOSIS — R269 Unspecified abnormalities of gait and mobility: Secondary | ICD-10-CM | POA: Insufficient documentation

## 2018-11-19 DIAGNOSIS — R296 Repeated falls: Secondary | ICD-10-CM

## 2018-12-16 ENCOUNTER — Other Ambulatory Visit: Payer: Self-pay | Admitting: Neurosurgery

## 2019-02-01 NOTE — Pre-Procedure Instructions (Addendum)
Cecil CrankerDonna M Steib  02/01/2019      [facility] [place] [address] [place] [address] Phone: [phone] Fax: [phone]  [facility] [place] - [address] [place] [address] Phone: [phone] Fax: [phone]    Your procedure is scheduled on Wednesday 02/09/2019.  [facility] Admitting [facility] at 0530 A.M.  Call this number if you have problems the morning of surgery:  [phone]   Remember:  Do not eat or drink after midnight.     Take these medicines the morning of surgery with A SIP OF WATER: Amlodipine (Norvasc) Ranitidine (Zantac)  If needed: albuterol inhaler (Proventil HFA; Ventolin HFA) Please bring all inhalers with you the day of surgery.   7 days prior to surgery STOP taking any Aspirin (unless otherwise instructed by your surgeon), Aleve, Naproxen, Ibuprofen, Motrin, Advil, Goody's, BC's, all herbal medications, fish oil, and all vitamins.   WHAT DO I DO ABOUT MY DIABETES MEDICATION?  . THE DAY BEFORE SURGERY, take usual doses of Metformin (glucophage) and Glimepiride (Amaryl).   . Do not take oral diabetes medicines (pills) the morning of surgery.    How to Manage Your Diabetes Before and After Surgery  Why is it important to control my blood sugar before and after surgery? . Improving blood sugar levels before and after surgery helps healing and can limit problems. . A way of improving blood sugar control is eating a healthy diet by: o  Eating less sugar and carbohydrates o  Increasing activity/exercise o  Talking with your doctor about reaching your blood sugar goals . High blood sugars (greater than 180 mg/dL) can raise your risk of infections and slow your recovery, so you will need to focus on controlling your diabetes during the weeks before surgery. . Make sure that the doctor who takes care of your diabetes knows about your  planned surgery including the date and location.  How do I manage my blood sugar before surgery? . Check your blood sugar at least 4 times a day, starting 2 days before surgery, to make sure that the level is not too high or low. o Check your blood sugar the morning of your surgery when you wake up and every 2 hours until you get to the Short Stay unit. . If your blood sugar is less than 70 mg/dL, you will need to treat for low blood sugar: o Do not take insulin. o Treat a low blood sugar (less than 70 mg/dL) with  cup of clear juice (cranberry or apple), 4 glucose tablets, OR glucose gel. o Recheck blood sugar in 15 minutes after treatment (to make sure it is greater than 70 mg/dL). If your blood sugar is not greater than 70 mg/dL on recheck, call [phone] for further instructions. . Report your blood sugar to the short stay nurse when you get to Short Stay.  . If you are admitted to the hospital after surgery: o Your blood sugar will be checked by the staff and you will probably be given insulin after surgery (instead of oral diabetes medicines) to make sure you have good blood sugar levels. o The goal for blood sugar control after surgery is 80-180 mg/dL.      Do not wear jewelry, make-up or nail polish.  Do not wear lotions, powders, or perfumes, or deodorant.  Do not shave 48 hours prior to surgery.   Do not bring  valuables to the hospital.  [facility] is not responsible for any belongings or valuables.  Contacts, eyeglasses, hearing aids, dentures or bridgework may not be worn into surgery.  Leave your suitcase in the car.  After surgery it may be brought to your room.  For patients admitted to the hospital, discharge time will be determined by your treatment team.  Patients discharged the day of surgery will not be allowed to drive home.   Special instructions:   [place]- Preparing For Surgery  Before surgery, you can play an important role. Because skin is not  sterile, your skin needs to be as free of germs as possible. You can reduce the number of germs on your skin by washing with CHG (chlorahexidine gluconate) Soap before surgery.  CHG is an antiseptic cleaner which kills germs and bonds with the skin to continue killing germs even after washing.    Oral Hygiene is also important to reduce your risk of infection.  Remember - BRUSH YOUR TEETH THE MORNING OF SURGERY WITH YOUR REGULAR TOOTHPASTE  Please do not use if you have an allergy to CHG or antibacterial soaps. If your skin becomes reddened/irritated stop using the CHG.  Do not shave (including legs and underarms) for at least 48 hours prior to first CHG shower. It is OK to shave your face.  Please follow these instructions carefully.   1. Shower the NIGHT BEFORE SURGERY and the MORNING OF SURGERY with CHG.   2. If you chose to wash your hair, wash your hair first as usual with your normal shampoo.  3. After you shampoo, rinse your hair and body thoroughly to remove the shampoo.  4. Use CHG as you would any other liquid soap. You can apply CHG directly to the skin and wash gently with a scrungie or a clean washcloth.   5. Apply the CHG Soap to your body ONLY FROM THE NECK DOWN.  Do not use on open wounds or open sores. Avoid contact with your eyes, ears, mouth and genitals (private parts). Wash Face and genitals (private parts)  with your normal soap.  6. Wash thoroughly, paying special attention to the area where your surgery will be performed.  7. Thoroughly rinse your body with warm water from the neck down.  8. DO NOT shower/wash with your normal soap after using and rinsing off the CHG Soap.  9. Pat yourself dry with a CLEAN TOWEL.  10. Wear CLEAN PAJAMAS to bed the night before surgery, wear comfortable clothes the morning of surgery  11. Place CLEAN SHEETS on your bed the night of your first shower and DO NOT SLEEP WITH PETS.    Day of Surgery: Shower as stated above. Do not  apply any deodorants/lotions.  Please wear clean clothes to the hospital/surgery center.   Remember to brush your teeth WITH YOUR REGULAR TOOTHPASTE.    Please read over the following fact sheets that you were given. Pain Booklet, Coughing and Deep Breathing, MRSA Information and Surgical Site Infection Prevention

## 2019-02-02 ENCOUNTER — Other Ambulatory Visit: Payer: Self-pay

## 2019-02-02 ENCOUNTER — Encounter (HOSPITAL_COMMUNITY)
Admission: RE | Admit: 2019-02-02 | Discharge: 2019-02-02 | Disposition: A | Discharge status: Home / Self Care | Payer: Medicaid Other | Source: Ambulatory Visit | Attending: Neurosurgery | Admitting: Neurosurgery

## 2019-02-02 ENCOUNTER — Encounter (HOSPITAL_COMMUNITY): Payer: Self-pay

## 2019-02-02 DIAGNOSIS — E118 Type 2 diabetes mellitus with unspecified complications: Secondary | ICD-10-CM | POA: Diagnosis not present

## 2019-02-02 DIAGNOSIS — Z01818 Encounter for other preprocedural examination: Secondary | ICD-10-CM | POA: Diagnosis not present

## 2019-02-02 LAB — CBC
HCT: 42.8 % (ref 36.0–46.0)
Hemoglobin: 13.7 g/dL (ref 12.0–15.0)
MCH: 26.8 pg (ref 26.0–34.0)
MCHC: 32 g/dL (ref 30.0–36.0)
MCV: 83.8 fL (ref 80.0–100.0)
Platelets: 306 10*3/uL (ref 150–400)
RBC: 5.11 MIL/uL (ref 3.87–5.11)
RDW: 14 % (ref 11.5–15.5)
WBC: 6.1 10*3/uL (ref 4.0–10.5)
nRBC: 0 % (ref 0.0–0.2)

## 2019-02-02 LAB — HEMOGLOBIN A1C
Hgb A1c MFr Bld: 6.5 % — ABNORMAL HIGH (ref 4.8–5.6)
Mean Plasma Glucose: 139.85 mg/dL

## 2019-02-02 LAB — TYPE AND SCREEN
ABO/RH(D): A POS
Antibody Screen: NEGATIVE

## 2019-02-02 LAB — SURGICAL PCR SCREEN
MRSA, PCR: NEGATIVE
Staphylococcus aureus: NEGATIVE

## 2019-02-02 LAB — BASIC METABOLIC PANEL
Anion gap: 9 (ref 5–15)
BUN: 7 mg/dL (ref 6–20)
CO2: 25 mmol/L (ref 22–32)
Calcium: 9.4 mg/dL (ref 8.9–10.3)
Chloride: 106 mmol/L (ref 98–111)
Creatinine, Ser: 0.73 mg/dL (ref 0.44–1.00)
GFR calc Af Amer: >60 mL/min (ref >60)
GFR calc non Af Amer: >60 mL/min (ref >60)
Glucose, Bld: 113 mg/dL — ABNORMAL HIGH (ref 70–99)
Potassium: 4.3 mmol/L (ref 3.5–5.1)
Sodium: 140 mmol/L (ref 135–145)

## 2019-02-02 LAB — GLUCOSE, CAPILLARY: Glucose-Capillary: 111 mg/dL — ABNORMAL HIGH (ref 70–99)

## 2019-02-02 LAB — ABO/RH: ABO/RH(D): A POS

## 2019-02-02 NOTE — Progress Notes (Signed)
PCP - Pincus Large joelin Cardiologist - denies  Chest x-ray - not needed EKG - 02/02/19 Stress Test - denies ECHO - 06/2015 Cardiac Cath - denies  Fasting Sugar: patient stated 250 but she is a poor historian  Checks Blood Sugar _1____ times a day  Blood Thinner Instructions: not needed Aspirin Instructions: not needed  Anesthesia review: yes, Dm   Patient denies shortness of breath, fever, cough and chest pain at PAT appointment   Patient verbalized understanding of instructions that were given to them at the PAT appointment. Patient was also instructed that they will need to review over the PAT instructions again at home before surgery.

## 2019-02-03 NOTE — Anesthesia Preprocedure Evaluation (Addendum)
Anesthesia Evaluation  Patient identified by MRN, date of birth, ID band Patient awake    Reviewed: Allergy & Precautions, NPO status , Patient's Chart, lab work & pertinent test results  Airway Mallampati: II   Neck ROM: limited    Dental   Pulmonary asthma , Current Smoker,    breath sounds clear to auscultation       Cardiovascular hypertension,  Rhythm:regular Rate:Normal     Neuro/Psych    GI/Hepatic GERD  ,  Endo/Other  diabetes, Type 2  Renal/GU      Musculoskeletal   Abdominal   Peds  Hematology   Anesthesia Other Findings   Reproductive/Obstetrics                            Anesthesia Physical Anesthesia Plan  ASA: II  Anesthesia Plan: General   Post-op Pain Management:    Induction: Intravenous  PONV Risk Score and Plan: 2  Airway Management Planned: Oral ETT  Additional Equipment:   Intra-op Plan:   Post-operative Plan: Extubation in OR  Informed Consent: I have reviewed the patients History and Physical, chart, labs and discussed the procedure including the risks, benefits and alternatives for the proposed anesthesia with the patient or authorized representative who has indicated his/her understanding and acceptance.       Plan Discussed with: CRNA, Anesthesiologist and Surgeon  Anesthesia Plan Comments: (Echo in 2016 to eval abn EKG and mild DOE. Echo essentially normal, Dr. Diona Browner commented on result " Please let her know that LVEF is normal, there are no wall motion abnormalities to suggest previous heart attack. No major valvular abnormalities of concern. Recommend continued [facility]"  TTE 07/04/15: - Normal LV wall thickness with LVEF 55-60% and grade 1 diastolic   dysfunction. Mildly calcified mitral annulus with trivial mitral   regurgitation. Trivial tricuspid regurgitation with PASP 23 mmHg.)        Anesthesia Quick Evaluation

## 2019-02-08 NOTE — Progress Notes (Signed)
  Coronavirus Screening  Have you experienced the following symptoms:  Cough yes/no: No Fever (>100.4F)  yes/no: No Runny nose yes/no: No Sore throat yes/no: No Difficulty breathing/shortness of breath  yes/no: No  Have you or a family member traveled in the last 14 days and where? yes/no: No   Please remind your patients and families that hospital visitation restrictions are in effect and the importance of the restrictions.  

## 2019-02-09 ENCOUNTER — Encounter (HOSPITAL_COMMUNITY)
Admission: RE | Disposition: A | Discharge status: Home / Self Care | Payer: Self-pay | Source: Home / Self Care | Attending: Neurosurgery

## 2019-02-09 ENCOUNTER — Ambulatory Visit (HOSPITAL_COMMUNITY)
Admission: RE | Admit: 2019-02-09 | Discharge: 2019-02-09 | Disposition: A | Discharge status: Home / Self Care | Payer: Medicaid Other | Attending: Neurosurgery | Admitting: Neurosurgery

## 2019-02-09 ENCOUNTER — Ambulatory Visit (HOSPITAL_COMMUNITY): Payer: Medicaid Other | Admitting: Certified Registered Nurse Anesthetist

## 2019-02-09 ENCOUNTER — Ambulatory Visit (HOSPITAL_COMMUNITY): Payer: Medicaid Other

## 2019-02-09 ENCOUNTER — Encounter (HOSPITAL_COMMUNITY): Payer: Self-pay | Admitting: *Deleted

## 2019-02-09 ENCOUNTER — Other Ambulatory Visit: Payer: Self-pay

## 2019-02-09 ENCOUNTER — Ambulatory Visit (HOSPITAL_COMMUNITY): Payer: Medicaid Other | Admitting: Vascular Surgery

## 2019-02-09 DIAGNOSIS — F17210 Nicotine dependence, cigarettes, uncomplicated: Secondary | ICD-10-CM | POA: Insufficient documentation

## 2019-02-09 DIAGNOSIS — M4712 Other spondylosis with myelopathy, cervical region: Secondary | ICD-10-CM | POA: Insufficient documentation

## 2019-02-09 DIAGNOSIS — I10 Essential (primary) hypertension: Secondary | ICD-10-CM | POA: Diagnosis not present

## 2019-02-09 DIAGNOSIS — J45909 Unspecified asthma, uncomplicated: Secondary | ICD-10-CM | POA: Insufficient documentation

## 2019-02-09 DIAGNOSIS — Z7951 Long term (current) use of inhaled steroids: Secondary | ICD-10-CM | POA: Insufficient documentation

## 2019-02-09 DIAGNOSIS — K219 Gastro-esophageal reflux disease without esophagitis: Secondary | ICD-10-CM | POA: Insufficient documentation

## 2019-02-09 DIAGNOSIS — M4802 Spinal stenosis, cervical region: Secondary | ICD-10-CM | POA: Diagnosis not present

## 2019-02-09 DIAGNOSIS — Z79899 Other long term (current) drug therapy: Secondary | ICD-10-CM | POA: Diagnosis not present

## 2019-02-09 DIAGNOSIS — Z419 Encounter for procedure for purposes other than remedying health state, unspecified: Secondary | ICD-10-CM

## 2019-02-09 DIAGNOSIS — Z7984 Long term (current) use of oral hypoglycemic drugs: Secondary | ICD-10-CM | POA: Diagnosis not present

## 2019-02-09 DIAGNOSIS — M4722 Other spondylosis with radiculopathy, cervical region: Secondary | ICD-10-CM | POA: Insufficient documentation

## 2019-02-09 DIAGNOSIS — E119 Type 2 diabetes mellitus without complications: Secondary | ICD-10-CM | POA: Insufficient documentation

## 2019-02-09 HISTORY — PX: ANTERIOR CERVICAL DECOMP/DISCECTOMY FUSION: SHX1161

## 2019-02-09 LAB — GLUCOSE, CAPILLARY
Glucose-Capillary: 146 mg/dL — ABNORMAL HIGH (ref 70–99)
Glucose-Capillary: 144 mg/dL — ABNORMAL HIGH (ref 70–99)
Glucose-Capillary: 211 mg/dL — ABNORMAL HIGH (ref 70–99)

## 2019-02-09 LAB — HEMOGLOBIN A1C
Hgb A1c MFr Bld: 6.6 % — ABNORMAL HIGH (ref 4.8–5.6)
Mean Plasma Glucose: 142.72 mg/dL

## 2019-02-09 SURGERY — ANTERIOR CERVICAL DECOMPRESSION/DISCECTOMY FUSION 1 LEVEL
Anesthesia: General

## 2019-02-09 MED ORDER — MIDAZOLAM HCL 2 MG/2ML IJ SOLN
INTRAMUSCULAR | Status: AC
  Filled 2019-02-09: qty 2

## 2019-02-09 MED ORDER — FENTANYL CITRATE (PF) 250 MCG/5ML IJ SOLN
INTRAMUSCULAR | Status: AC
  Filled 2019-02-09: qty 5

## 2019-02-09 MED ORDER — ONDANSETRON HCL 4 MG/2ML IJ SOLN: 4.0000 mg | Freq: Four times a day (QID) | INTRAMUSCULAR | Status: DC | PRN

## 2019-02-09 MED ORDER — CHLORHEXIDINE GLUCONATE CLOTH 2 % EX PADS: 6.0000 | MEDICATED_PAD | Freq: Once | CUTANEOUS | Status: DC

## 2019-02-09 MED ORDER — BACITRACIN ZINC 500 UNIT/GM EX OINT
TOPICAL_OINTMENT | CUTANEOUS | Status: AC
  Filled 2019-02-09: qty 28.35

## 2019-02-09 MED ORDER — CEFAZOLIN SODIUM-DEXTROSE 2-4 GM/100ML-% IV SOLN
2.0000 g | INTRAVENOUS | Status: AC
  Administered 2019-02-09: 08:00:00 2 g via INTRAVENOUS
  Filled 2019-02-09: qty 100

## 2019-02-09 MED ORDER — BISACODYL 10 MG RE SUPP: 10.0000 mg | Freq: Every day | RECTAL | Status: DC | PRN

## 2019-02-09 MED ORDER — DOCUSATE SODIUM 100 MG PO CAPS: 100.0000 mg | ORAL_CAPSULE | Freq: Two times a day (BID) | ORAL | 0 refills | Status: AC

## 2019-02-09 MED ORDER — MENTHOL 3 MG MT LOZG: 1.0000 | LOZENGE | OROMUCOSAL | Status: DC | PRN

## 2019-02-09 MED ORDER — OXYCODONE HCL 5 MG PO TABS: 5.0000 mg | ORAL_TABLET | ORAL | Status: DC | PRN

## 2019-02-09 MED ORDER — LACTATED RINGERS IV SOLN
INTRAVENOUS | Status: DC | PRN
  Administered 2019-02-09 (×2): via INTRAVENOUS

## 2019-02-09 MED ORDER — BUPIVACAINE-EPINEPHRINE (PF) 0.5% -1:200000 IJ SOLN
INTRAMUSCULAR | Status: AC
  Filled 2019-02-09: qty 30

## 2019-02-09 MED ORDER — ACETAMINOPHEN 500 MG PO TABS
1000.0000 mg | ORAL_TABLET | Freq: Four times a day (QID) | ORAL | Status: DC
  Administered 2019-02-09: 12:00:00 1000 mg via ORAL
  Filled 2019-02-09: qty 2

## 2019-02-09 MED ORDER — CYCLOBENZAPRINE HCL 10 MG PO TABS
ORAL_TABLET | ORAL | Status: AC
  Filled 2019-02-09: qty 1

## 2019-02-09 MED ORDER — CYCLOBENZAPRINE HCL 10 MG PO TABS: 10.0000 mg | ORAL_TABLET | Freq: Three times a day (TID) | ORAL | 0 refills | Status: AC | PRN

## 2019-02-09 MED ORDER — ACETAMINOPHEN 325 MG PO TABS: 650.0000 mg | ORAL_TABLET | ORAL | Status: DC | PRN

## 2019-02-09 MED ORDER — LIDOCAINE 2% (20 MG/ML) 5 ML SYRINGE
INTRAMUSCULAR | Status: AC
  Filled 2019-02-09: qty 5

## 2019-02-09 MED ORDER — ACETAMINOPHEN 650 MG RE SUPP: 650.0000 mg | RECTAL | Status: DC | PRN

## 2019-02-09 MED ORDER — ROCURONIUM BROMIDE 50 MG/5ML IV SOSY
PREFILLED_SYRINGE | INTRAVENOUS | Status: DC | PRN
  Administered 2019-02-09: 50 mg via INTRAVENOUS

## 2019-02-09 MED ORDER — ONDANSETRON HCL 4 MG PO TABS: 4.0000 mg | ORAL_TABLET | Freq: Four times a day (QID) | ORAL | Status: DC | PRN

## 2019-02-09 MED ORDER — OXYCODONE HCL 5 MG PO TABS
10.0000 mg | ORAL_TABLET | ORAL | Status: DC | PRN
  Administered 2019-02-09: 11:00:00 10 mg via ORAL

## 2019-02-09 MED ORDER — MONTELUKAST SODIUM 10 MG PO TABS: 10.0000 mg | ORAL_TABLET | Freq: Every day | ORAL | Status: DC

## 2019-02-09 MED ORDER — LIDOCAINE 2% (20 MG/ML) 5 ML SYRINGE
INTRAMUSCULAR | Status: DC | PRN
  Administered 2019-02-09: 60 mg via INTRAVENOUS

## 2019-02-09 MED ORDER — ALBUTEROL SULFATE HFA 108 (90 BASE) MCG/ACT IN AERS
INHALATION_SPRAY | RESPIRATORY_TRACT | Status: AC
  Filled 2019-02-09: qty 6.7

## 2019-02-09 MED ORDER — AMLODIPINE BESYLATE 5 MG PO TABS
5.0000 mg | ORAL_TABLET | Freq: Every day | ORAL | Status: DC
  Administered 2019-02-09: 12:00:00 5 mg via ORAL
  Filled 2019-02-09: qty 1

## 2019-02-09 MED ORDER — FENTANYL CITRATE (PF) 100 MCG/2ML IJ SOLN: 25.0000 ug | INTRAMUSCULAR | Status: DC | PRN

## 2019-02-09 MED ORDER — ROCURONIUM BROMIDE 50 MG/5ML IV SOSY
PREFILLED_SYRINGE | INTRAVENOUS | Status: AC
  Filled 2019-02-09: qty 5

## 2019-02-09 MED ORDER — CEFAZOLIN SODIUM-DEXTROSE 2-4 GM/100ML-% IV SOLN
2.0000 g | Freq: Three times a day (TID) | INTRAVENOUS | Status: DC
  Filled 2019-02-09 (×2): qty 100

## 2019-02-09 MED ORDER — DEXAMETHASONE SODIUM PHOSPHATE 10 MG/ML IJ SOLN
INTRAMUSCULAR | Status: AC
  Filled 2019-02-09: qty 1

## 2019-02-09 MED ORDER — ONDANSETRON HCL 4 MG/2ML IJ SOLN
INTRAMUSCULAR | Status: AC
  Filled 2019-02-09: qty 2

## 2019-02-09 MED ORDER — MORPHINE SULFATE (PF) 4 MG/ML IV SOLN: 4.0000 mg | INTRAVENOUS | Status: DC | PRN

## 2019-02-09 MED ORDER — DEXAMETHASONE 4 MG PO TABS
4.0000 mg | ORAL_TABLET | Freq: Four times a day (QID) | ORAL | Status: DC
  Administered 2019-02-09: 12:00:00 4 mg via ORAL
  Filled 2019-02-09: qty 1

## 2019-02-09 MED ORDER — OXYCODONE HCL 5 MG PO TABS: 5.0000 mg | ORAL_TABLET | Freq: Once | ORAL | Status: DC | PRN

## 2019-02-09 MED ORDER — 0.9 % SODIUM CHLORIDE (POUR BTL) OPTIME
TOPICAL | Status: DC | PRN
  Administered 2019-02-09: 07:00:00 1000 mL

## 2019-02-09 MED ORDER — PANTOPRAZOLE SODIUM 40 MG IV SOLR: 40.0000 mg | Freq: Every day | INTRAVENOUS | Status: DC

## 2019-02-09 MED ORDER — PROPOFOL 10 MG/ML IV BOLUS
INTRAVENOUS | Status: DC | PRN
  Administered 2019-02-09: 100 mg via INTRAVENOUS

## 2019-02-09 MED ORDER — ZOLPIDEM TARTRATE 5 MG PO TABS: 5.0000 mg | ORAL_TABLET | Freq: Every evening | ORAL | Status: DC | PRN

## 2019-02-09 MED ORDER — OXYCODONE HCL 5 MG PO TABS
ORAL_TABLET | ORAL | Status: AC
  Filled 2019-02-09: qty 2

## 2019-02-09 MED ORDER — MIDAZOLAM HCL 2 MG/2ML IJ SOLN
INTRAMUSCULAR | Status: DC | PRN
  Administered 2019-02-09 (×2): 1 mg via INTRAVENOUS

## 2019-02-09 MED ORDER — CYCLOBENZAPRINE HCL 10 MG PO TABS
10.0000 mg | ORAL_TABLET | Freq: Three times a day (TID) | ORAL | Status: DC | PRN
  Administered 2019-02-09: 10 mg via ORAL

## 2019-02-09 MED ORDER — FAMOTIDINE 20 MG PO TABS
20.0000 mg | ORAL_TABLET | Freq: Two times a day (BID) | ORAL | Status: DC
  Administered 2019-02-09: 13:00:00 20 mg via ORAL
  Filled 2019-02-09: qty 1

## 2019-02-09 MED ORDER — DEXAMETHASONE SODIUM PHOSPHATE 4 MG/ML IJ SOLN: 4.0000 mg | Freq: Four times a day (QID) | INTRAMUSCULAR | Status: DC

## 2019-02-09 MED ORDER — ALBUMIN HUMAN 5 % IV SOLN
INTRAVENOUS | Status: DC | PRN
  Administered 2019-02-09: 09:00:00 via INTRAVENOUS

## 2019-02-09 MED ORDER — ONDANSETRON HCL 4 MG/2ML IJ SOLN
INTRAMUSCULAR | Status: DC | PRN
  Administered 2019-02-09: 4 mg via INTRAVENOUS

## 2019-02-09 MED ORDER — INSULIN ASPART 100 UNIT/ML ~~LOC~~ SOLN
0.0000 [IU] | SUBCUTANEOUS | Status: DC
  Administered 2019-02-09: 12:00:00 7 [IU] via SUBCUTANEOUS

## 2019-02-09 MED ORDER — FENTANYL CITRATE (PF) 250 MCG/5ML IJ SOLN
INTRAMUSCULAR | Status: DC | PRN
  Administered 2019-02-09 (×2): 50 ug via INTRAVENOUS
  Administered 2019-02-09: 100 ug via INTRAVENOUS
  Administered 2019-02-09: 50 ug via INTRAVENOUS

## 2019-02-09 MED ORDER — ALBUTEROL SULFATE (2.5 MG/3ML) 0.083% IN NEBU: 3.0000 mL | INHALATION_SOLUTION | RESPIRATORY_TRACT | Status: DC | PRN

## 2019-02-09 MED ORDER — OXYCODONE HCL 5 MG/5ML PO SOLN: 5.0000 mg | Freq: Once | ORAL | Status: DC | PRN

## 2019-02-09 MED ORDER — ALBUTEROL SULFATE HFA 108 (90 BASE) MCG/ACT IN AERS
INHALATION_SPRAY | RESPIRATORY_TRACT | Status: DC | PRN
  Administered 2019-02-09: 4 via RESPIRATORY_TRACT

## 2019-02-09 MED ORDER — LACTATED RINGERS IV SOLN: INTRAVENOUS | Status: DC

## 2019-02-09 MED ORDER — SUGAMMADEX SODIUM 200 MG/2ML IV SOLN
INTRAVENOUS | Status: DC | PRN
  Administered 2019-02-09: 122.8 mg via INTRAVENOUS

## 2019-02-09 MED ORDER — GLIMEPIRIDE 2 MG PO TABS
1.0000 mg | ORAL_TABLET | Freq: Every day | ORAL | Status: DC
  Filled 2019-02-09: qty 1

## 2019-02-09 MED ORDER — THROMBIN 5000 UNITS EX SOLR
CUTANEOUS | Status: AC
  Filled 2019-02-09: qty 5000

## 2019-02-09 MED ORDER — SODIUM CHLORIDE 0.9 % IV SOLN
INTRAVENOUS | Status: DC | PRN
  Administered 2019-02-09: 07:00:00

## 2019-02-09 MED ORDER — BACITRACIN ZINC 500 UNIT/GM EX OINT
TOPICAL_OINTMENT | CUTANEOUS | Status: DC | PRN
  Administered 2019-02-09: 1 via TOPICAL

## 2019-02-09 MED ORDER — PROPOFOL 10 MG/ML IV BOLUS
INTRAVENOUS | Status: AC
  Filled 2019-02-09: qty 40

## 2019-02-09 MED ORDER — BUPIVACAINE-EPINEPHRINE (PF) 0.5% -1:200000 IJ SOLN
INTRAMUSCULAR | Status: DC | PRN
  Administered 2019-02-09: 10 mL

## 2019-02-09 MED ORDER — SUCCINYLCHOLINE CHLORIDE 200 MG/10ML IV SOSY
PREFILLED_SYRINGE | INTRAVENOUS | Status: AC
  Filled 2019-02-09: qty 10

## 2019-02-09 MED ORDER — SODIUM CHLORIDE 0.9 % IV SOLN
INTRAVENOUS | Status: DC | PRN
  Administered 2019-02-09: 50 ug/min via INTRAVENOUS

## 2019-02-09 MED ORDER — SUCCINYLCHOLINE CHLORIDE 200 MG/10ML IV SOSY
PREFILLED_SYRINGE | INTRAVENOUS | Status: DC | PRN
  Administered 2019-02-09: 100 mg via INTRAVENOUS

## 2019-02-09 MED ORDER — OXYCODONE HCL 5 MG PO TABS: 5.0000 mg | ORAL_TABLET | ORAL | 0 refills | Status: AC | PRN

## 2019-02-09 MED ORDER — DEXAMETHASONE SODIUM PHOSPHATE 10 MG/ML IJ SOLN
INTRAMUSCULAR | Status: DC | PRN
  Administered 2019-02-09: 10 mg via INTRAVENOUS

## 2019-02-09 MED ORDER — BUDESONIDE 0.25 MG/2ML IN SUSP: 0.2500 mg | Freq: Two times a day (BID) | RESPIRATORY_TRACT | Status: DC

## 2019-02-09 MED ORDER — THROMBIN 5000 UNITS EX SOLR
OROMUCOSAL | Status: DC | PRN
  Administered 2019-02-09: 07:00:00 via TOPICAL

## 2019-02-09 MED ORDER — METFORMIN HCL 500 MG PO TABS: 1000.0000 mg | ORAL_TABLET | Freq: Two times a day (BID) | ORAL | Status: DC

## 2019-02-09 MED ORDER — ALUM & MAG HYDROXIDE-SIMETH 200-200-20 MG/5ML PO SUSP: 30.0000 mL | Freq: Four times a day (QID) | ORAL | Status: DC | PRN

## 2019-02-09 MED ORDER — PHENOL 1.4 % MT LIQD: 1.0000 | OROMUCOSAL | Status: DC | PRN

## 2019-02-09 MED ORDER — DOCUSATE SODIUM 100 MG PO CAPS: 100.0000 mg | ORAL_CAPSULE | Freq: Two times a day (BID) | ORAL | Status: DC

## 2019-02-09 SURGICAL SUPPLY — 60 items
ADH SKN CLS APL DERMABOND .7 (GAUZE/BANDAGES/DRESSINGS) ×1
BAG DECANTER FOR FLEXI CONT (MISCELLANEOUS) ×3 IMPLANT
BENZOIN TINCTURE PRP APPL 2/3 (GAUZE/BANDAGES/DRESSINGS) ×3 IMPLANT
BIT DRILL NEURO 2X3.1 SFT TUCH (MISCELLANEOUS) ×1 IMPLANT
BLADE SURG 15 STRL LF DISP TIS (BLADE) ×1 IMPLANT
BLADE SURG 15 STRL SS (BLADE) ×2
BLADE ULTRA TIP 2M (BLADE) ×3 IMPLANT
BUR BARREL STRAIGHT FLUTE 4.0 (BURR) ×3 IMPLANT
BUR MATCHSTICK NEURO 3.0 LAGG (BURR) ×3 IMPLANT
CANISTER SUCT 3000ML PPV (MISCELLANEOUS) ×3 IMPLANT
CARTRIDGE OIL MAESTRO DRILL (MISCELLANEOUS) ×1 IMPLANT
CLOSURE WOUND 1/2 X4 (GAUZE/BANDAGES/DRESSINGS) ×1
COVER MAYO STAND STRL (DRAPES) ×3 IMPLANT
COVER WAND RF STERILE (DRAPES) ×3 IMPLANT
DECANTER SPIKE VIAL GLASS SM (MISCELLANEOUS) ×3 IMPLANT
DERMABOND ADVANCED (GAUZE/BANDAGES/DRESSINGS) ×2
DERMABOND ADVANCED .7 DNX12 (GAUZE/BANDAGES/DRESSINGS) ×1 IMPLANT
DEVICE FUSION VISTA 11X14X8MM (Spacer) ×1 IMPLANT
DIFFUSER DRILL AIR PNEUMATIC (MISCELLANEOUS) ×3 IMPLANT
DRAPE LAPAROTOMY 100X72 PEDS (DRAPES) ×3 IMPLANT
DRAPE MICROSCOPE LEICA (MISCELLANEOUS) IMPLANT
DRAPE POUCH INSTRU U-SHP 10X18 (DRAPES) ×3 IMPLANT
DRAPE SURG 17X23 STRL (DRAPES) ×6 IMPLANT
DRILL NEURO 2X3.1 SOFT TOUCH (MISCELLANEOUS) ×3
DRSG OPSITE POSTOP 4X6 (GAUZE/BANDAGES/DRESSINGS) ×3 IMPLANT
ELECT REM PT RETURN 9FT ADLT (ELECTROSURGICAL) ×3
ELECTRODE REM PT RTRN 9FT ADLT (ELECTROSURGICAL) ×1 IMPLANT
GAUZE 4X4 16PLY RFD (DISPOSABLE) IMPLANT
GAUZE SPONGE 4X4 12PLY STRL (GAUZE/BANDAGES/DRESSINGS) ×3 IMPLANT
GLOVE BIO SURGEON STRL SZ8 (GLOVE) ×3 IMPLANT
GLOVE BIO SURGEON STRL SZ8.5 (GLOVE) ×3 IMPLANT
GLOVE EXAM NITRILE XL STR (GLOVE) IMPLANT
GOWN STRL REUS W/ TWL LRG LVL3 (GOWN DISPOSABLE) IMPLANT
GOWN STRL REUS W/ TWL XL LVL3 (GOWN DISPOSABLE) ×1 IMPLANT
GOWN STRL REUS W/TWL LRG LVL3 (GOWN DISPOSABLE)
GOWN STRL REUS W/TWL XL LVL3 (GOWN DISPOSABLE) ×2
HEMOSTAT POWDER KIT SURGIFOAM (HEMOSTASIS) ×3 IMPLANT
KIT BASIN OR (CUSTOM PROCEDURE TRAY) ×3 IMPLANT
KIT TURNOVER KIT B (KITS) ×3 IMPLANT
MARKER SKIN DUAL TIP RULER LAB (MISCELLANEOUS) ×3 IMPLANT
NEEDLE HYPO 22GX1.5 SAFETY (NEEDLE) ×3 IMPLANT
NEEDLE SPNL 18GX3.5 QUINCKE PK (NEEDLE) ×3 IMPLANT
NS IRRIG 1000ML POUR BTL (IV SOLUTION) ×3 IMPLANT
OIL CARTRIDGE MAESTRO DRILL (MISCELLANEOUS) ×3
PACK LAMINECTOMY NEURO (CUSTOM PROCEDURE TRAY) ×3 IMPLANT
PIN DISTRACTION 14MM (PIN) ×6 IMPLANT
PLATE ANT CERV XTEND 1 LV 14 (Plate) ×3 IMPLANT
PUTTY DBM 2CC CALC GRAN (Putty) ×3 IMPLANT
RUBBERBAND STERILE (MISCELLANEOUS) IMPLANT
SCREW XTD VAR 4.2 SELF TAP (Screw) ×12 IMPLANT
SPONGE INTESTINAL PEANUT (DISPOSABLE) ×3 IMPLANT
SPONGE SURGIFOAM ABS GEL SZ50 (HEMOSTASIS) IMPLANT
STRIP CLOSURE SKIN 1/2X4 (GAUZE/BANDAGES/DRESSINGS) ×2 IMPLANT
SUT VIC AB 0 CT1 27 (SUTURE) ×2
SUT VIC AB 0 CT1 27XBRD ANTBC (SUTURE) ×1 IMPLANT
SUT VIC AB 3-0 SH 8-18 (SUTURE) ×3 IMPLANT
TOWEL GREEN STERILE (TOWEL DISPOSABLE) ×3 IMPLANT
TOWEL GREEN STERILE FF (TOWEL DISPOSABLE) ×3 IMPLANT
VISTA 11X14X8MM (Spacer) ×3 IMPLANT
WATER STERILE IRR 1000ML POUR (IV SOLUTION) ×3 IMPLANT

## 2019-02-09 NOTE — Progress Notes (Signed)
Subjective: The patient is somnolent but easily arousable.  She is in no apparent distress.  She looks well.  Objective: Vital signs in last 24 hours: Temp:  [97.7 F (36.5 C)-98.3 F (36.8 C)] (P) 97.7 F (36.5 C) (04/22 0950) Pulse Rate:  [98] 98 (04/22 0604) BP: (188)/(95) 188/95 (04/22 0604) SpO2:  [100 %] 100 % (04/22 0604) Estimated body mass index is 26.44 kg/m as calculated from the following:   Height as of 02/02/19: 5' (1.524 m).   Weight as of 02/02/19: 61.4 kg.   Intake/Output from previous day: No intake/output data recorded. Intake/Output this shift: Total I/O In: 1550 [I.V.:1300; IV Piggyback:250] Out: 250 [Blood:250]  Physical exam the patient is somnolent but arousable.  She is moving all 4 extremities well.  Deltoid strength is normal.  Her dressing is clean and dry.  There is no hematoma or shift.  Lab Results: No results for input(s): WBC, HGB, HCT, PLT in the last 72 hours. BMET No results for input(s): NA, K, CL, CO2, GLUCOSE, BUN, CREATININE, CALCIUM in the last 72 hours.  Studies/Results: No results found.  Assessment/Plan: The patient is doing well.  I spoke with her son.  She will likely go home later today.  LOS: 0 days     Cristi Loron 02/09/2019, 9:58 AM

## 2019-02-09 NOTE — Anesthesia Postprocedure Evaluation (Signed)
Anesthesia Post Note  Patient: Jessica Kane  Procedure(s) Performed: Cervical four-five Anterior cervical decompression/discectomy/fusion/interbody prosthesis/plate/screws (N/A )     Patient location during evaluation: PACU Anesthesia Type: General Level of consciousness: awake and alert Pain management: pain level controlled Vital Signs Assessment: post-procedure vital signs reviewed and stable Respiratory status: spontaneous breathing, nonlabored ventilation, respiratory function stable and patient connected to nasal cannula oxygen Cardiovascular status: blood pressure returned to baseline and stable Postop Assessment: no apparent nausea or vomiting Anesthetic complications: no    Last Vitals:  Vitals:   02/09/19 1050 02/09/19 1137  BP:  (!) 162/89  Pulse:  87  Resp:  16  Temp: 36.5 C 37.1 C  SpO2:  100%    Last Pain:  Vitals:   02/09/19 1137  TempSrc: Oral  PainSc:                  Levin Dagostino S

## 2019-02-09 NOTE — Discharge Summary (Signed)
Physician Discharge Summary  Patient ID: Jessica Kane MRN: [id number] DOB/AGE: 1961/05/02 58 y.o.  Admit date: 02/09/2019 Discharge date: 02/09/2019  Admission Diagnoses:Cervical spondylosis, cervical stenosis, cervical myelopathy, cervical radiculopathy, cervicalgia  Discharge Diagnoses:  The same Active Problems:   Cervical spondylosis with myelopathy and radiculopathy   Discharged Condition: good  Hospital Course:    I performed a C4-5 anterior cervical diskectomy, fusion and plating on the patient on 02/09/2019.  The surgery went well.    The patient's postoperative course was unremarkable.  She requested discharge to home.  She was given written and oral  discharge instructions.  All questions were answered.  Consults:  None Significant Diagnostic Studies: none Treatments: C4-5 anterior cervical diskectomy, fusion and plating Discharge Exam: Blood pressure (!) 146/81, pulse 93, temperature 98 F (36.7 C), temperature source Oral, resp. rate 16, SpO2 100 %.  the patient is alert and pleasant.  She is ambulating.  She is moving all 4 extremities well.  Her dressing is clean and dry.  Disposition:   Home  Discharge Instructions     Remove dressing in 72 hours   Complete by:  As directed    Call MD for:  difficulty breathing, headache or visual disturbances   Complete by:  As directed    Call MD for:  extreme fatigue   Complete by:  As directed    Call MD for:  hives   Complete by:  As directed    Call MD for:  persistant dizziness or light-headedness   Complete by:  As directed    Call MD for:  persistant nausea and vomiting   Complete by:  As directed    Call MD for:  redness, tenderness, or signs of infection (pain, swelling, redness, odor or green/yellow discharge around incision site)   Complete by:  As directed    Call MD for:  severe uncontrolled pain   Complete by:  As directed    Call MD for:  temperature >100.4   Complete by:  As directed    Diet - low sodium  heart healthy   Complete by:  As directed    Discharge instructions   Complete by:  As directed    Call [phone] for a followup appointment. Take a stool softener while you are using pain medications.   Driving Restrictions   Complete by:  As directed    Do not drive for 2 weeks.   Increase activity slowly   Complete by:  As directed    Lifting restrictions   Complete by:  As directed    Do not lift more than 5 pounds. No excessive bending or twisting.   May shower / Bathe   Complete by:  As directed    Remove the dressing for 3 days after surgery.  You may shower, but leave the incision alone.     Allergies as of 02/09/2019   No Known Allergies     Medication List    TAKE these medications   albuterol 108 (90 Base) MCG/ACT inhaler Commonly known as:  VENTOLIN HFA Inhale 2 puffs into the lungs every 4 (four) hours as needed for wheezing or shortness of breath.   amLODipine 5 MG tablet Commonly known as:  NORVASC Take 5 mg by mouth every morning.   cyclobenzaprine 10 MG tablet Commonly known as:  FLEXERIL Take 1 tablet (10 mg total) by mouth 3 (three) times daily as needed for muscle spasms.   dicyclomine 10 MG capsule Commonly known as:  BENTYL Take 1 capsule (10 mg total) by mouth 4 (four) times daily as needed for spasms.   docusate sodium 100 MG capsule Commonly known as:  COLACE Take 1 capsule (100 mg total) by mouth 2 (two) times daily.   fluticasone 44 MCG/ACT inhaler Commonly known as:  FLOVENT HFA Inhale 1 puff into the lungs 2 (two) times daily.   glimepiride 1 MG tablet Commonly known as:  AMARYL Take 1 mg by mouth daily with breakfast.   metFORMIN 1000 MG tablet Commonly known as:  GLUCOPHAGE Take 1,000 mg by mouth 2 (two) times daily with a meal.   montelukast 10 MG tablet Commonly known as:  SINGULAIR Take 10 mg by mouth at bedtime.   oxyCODONE 5 MG immediate release tablet Commonly known as:  Oxy IR/ROXICODONE Take 1 tablet (5 mg total)  by mouth every 4 (four) hours as needed for moderate pain ((score 4 to 6)).   pantoprazole 40 MG tablet Commonly known as:  PROTONIX Take 1 tablet (40 mg total) by mouth 2 (two) times daily before a meal.   ranitidine 150 MG capsule Commonly known as:  ZANTAC 1 PO BID Monday  THRU FRIDAY What changed:    how much to take  how to take this  when to take this  additional instructions   simethicone 80 MG chewable tablet Commonly known as:  Gas-X Chew 1 tablet (80 mg total) by mouth every 6 (six) hours as needed for flatulence.        Signed: Cristi LoronJeffrey D Shere Eisenhart 02/09/2019, 3:59 PM

## 2019-02-09 NOTE — Op Note (Signed)
Brief history: The patient is a 58 year old black female who presented with hyperreflexia and unsteady gait.  She was worked up with a cervical MRI which demonstrated spondylosis stenosis and spinal cord signal change at C4-5.  I discussed the various treatment options with her and recommended surgery.  She has weighed the risks, benefits, and alternatives of surgery and decided to proceed with a C4-5 anterior cervical discectomy, fusion and plating.  Preoperative diagnosis: C4-5 spondylosis, stenosis, myelopathy, cervicalgia, cervical radiculopathy  Postoperative diagnosis: The same  Procedure: C4-5 anterior cervical discectomy/decompression; C4-5 interbody arthrodesis with local morcellized autograft bone and Zimmer DBM; insertion of interbody prosthesis at C4-5 (Zimmer peek interbody prosthesis); anterior cervical plating from C4-5 with globus titanium plate  Surgeon: Dr. Delma OfficerJeff Solace Manwarren  Asst.: Dr. Ervin Knackhomas Ostergaard  Anesthesia: Gen. endotracheal  Estimated blood loss: 75 cc  Drains: None  Complications: None  Description of procedure: The patient was brought to the operating room by the anesthesia team. General endotracheal anesthesia was induced. A roll was placed under the patient's shoulders to keep the neck in the neutral position. The patient's anterior cervical region was then prepared with Betadine scrub and Betadine solution. Sterile drapes were applied.  The area to be incised was then injected with Marcaine with epinephrine solution. I then used a scalpel to make a transverse incision in the patient's left anterior neck. I used the Metzenbaum scissors to divide the platysmal muscle and then to dissect medial to the sternocleidomastoid muscle, jugular vein, and carotid artery. I carefully dissected down towards the anterior cervical spine identifying the esophagus and retracting it medially. Then using Kitner swabs to clear soft tissue from the anterior cervical spine. We then  inserted a bent spinal needle into the upper exposed intervertebral disc space. We then obtained intraoperative radiographs confirm our location.  I then used electrocautery to detach the medial border of the longus colli muscle bilaterally from the C4-5 intervertebral disc spaces. I then inserted the Caspar self-retaining retractor underneath the longus colli muscle bilaterally to provide exposure.  We then incised the intervertebral disc at C4-5. We then performed a partial intervertebral discectomy with a pituitary forceps and the Karlin curettes. I then inserted distraction screws into the vertebral bodies at C4-5. We then distracted the interspace. We then used the high-speed drill to decorticate the vertebral endplates at C4-5, to drill away the remainder of the intervertebral disc, to drill away some posterior spondylosis, and to thin out the posterior longitudinal ligament. I then incised ligament with the arachnoid knife. We then removed the ligament with a Kerrison punches undercutting the vertebral endplates and decompressing the thecal sac. We then performed foraminotomies about the bilateral C5 nerve roots. This completed the decompression at this level.  We now turned our to attention to the interbody fusion. We used the trial spacers to determine the appropriate size for the interbody prosthesis. We then pre-filled prosthesis with a combination of local morcellized autograft bone that we obtained during decompression as well as Zimmer DBM. We then inserted the prosthesis into the distracted interspace at C4-5. We then removed the distraction screws. There was a good snug fit of the prosthesis in the interspace.  Having completed the fusion we now turned attention to the anterior spinal instrumentation. We used the high-speed drill to drill away some anterior spondylosis at the disc spaces so that the plate lay down flat. We selected the appropriate length titanium anterior cervical plate. We  laid it along the anterior aspect of the vertebral bodies  from C4-5. We then drilled 12 mm holes at C4 and C5. We then secured the plate to the vertebral bodies by placing two 12 mm self-tapping screws at C4 and C5. We then obtained intraoperative radiograph. The demonstrating good position of the instrumentation. We therefore secured the screws the plate the locking each cam. This completed the instrumentation.  We then obtained hemostasis using bipolar electrocautery. We irrigated the wound out with bacitracin solution. We then removed the retractor. We inspected the esophagus for any damage. There was none apparent. We then reapproximated patient's platysmal muscle with interrupted 3-0 Vicryl suture. We then reapproximated the subcutaneous tissue with interrupted 3-0 Vicryl suture. The skin was reapproximated with Steri-Strips and benzoin. The wound was then covered with bacitracin ointment. A sterile dressing was applied. The drapes were removed. Patient was subsequently extubated by the anesthesia team and transported to the post anesthesia care unit in stable condition. All sponge instrument and needle counts were reportedly correct at the end of this case.

## 2019-02-09 NOTE — Transfer of Care (Signed)
Immediate Anesthesia Transfer of Care Note  Patient: Jessica Kane  Procedure(s) Performed: Cervical four-five Anterior cervical decompression/discectomy/fusion/interbody prosthesis/plate/screws (N/A )  Patient Location: PACU  Anesthesia Type:General  Level of Consciousness: drowsy and patient cooperative  Airway & Oxygen Therapy: Patient Spontanous Breathing and Patient connected to face mask oxygen  Post-op Assessment: Report given to RN and Post -op Vital signs reviewed and stable  Post vital signs: Reviewed and stable  Last Vitals:  Vitals Value Taken Time  BP 165/109 02/09/2019  9:49 AM  Temp    Pulse 119 02/09/2019  9:51 AM  Resp 28 02/09/2019  9:51 AM  SpO2 100 % 02/09/2019  9:51 AM  Vitals shown include unvalidated device data.  Last Pain:  Vitals:   02/09/19 0604  TempSrc: Oral  PainSc: 4          Complications: No apparent anesthesia complications

## 2019-02-09 NOTE — Anesthesia Procedure Notes (Signed)
Procedure Name: Intubation Date/Time: 02/09/2019 8:02 AM Performed by: Modena Morrow, CRNA Pre-anesthesia Checklist: Patient identified, Emergency Drugs available, Suction available, Patient being monitored and Timeout performed Patient Re-evaluated:Patient Re-evaluated prior to induction Oxygen Delivery Method: Circle system utilized Preoxygenation: Pre-oxygenation with 100% oxygen Induction Type: IV induction, Rapid sequence and Cricoid Pressure applied Laryngoscope Size: Miller and 2 Grade View: Grade I Tube type: Oral Tube size: 7.0 mm Number of attempts: 1 Airway Equipment and Method: Stylet Placement Confirmation: ETT inserted through vocal cords under direct vision,  positive ETCO2 and breath sounds checked- equal and bilateral Secured at: 22 cm Tube secured with: Tape Dental Injury: Teeth and Oropharynx as per pre-operative assessment

## 2019-02-09 NOTE — Progress Notes (Signed)
Discharge instructions given. Pt verbalized understanding and all questions were answered.  

## 2019-02-09 NOTE — H&P (Signed)
Subjective: Patient is a 58 year old black female who presented with an unsteady gait.  She was worked up with a cervical MRI which demonstrated significant stenosis and myelopathy at C4-5.  I discussed the various treatment option with the patient including surgery.  She has decided to proceed with a C4-5 anterior cervicectomy fusion and plating.  Past Medical History:  Diagnosis Date  . Asthma   . Essential hypertension   . Gastritis and duodenitis   . GERD (gastroesophageal reflux disease)   . Internal hemorrhoids   . Type 2 diabetes mellitus (HCC)     Past Surgical History:  Procedure Laterality Date  . BIOPSY N/A 06/04/2015   Procedure: GASTRIC BIOPSIES;  Surgeon: West BaliSandi L Fields, MD;  Location: [facility];  Service: Endoscopy;  Laterality: N/A;  . CESAREAN SECTION    . COLONOSCOPY  2007   Dr. Allena KatzPatel: mild diverticulosis  . COLONOSCOPY WITH PROPOFOL N/A 06/04/2015   Dr. Darrick PennaFields: 1. the left colon is redundant 2. Small internal hemorrhoids 3. The  colonici mucosa appeared normal> next colonscopy in 10 years  . ESOPHAGOGASTRODUODENOSCOPY  2006   Dr. Allena KatzPatel: mild to moderate diffuse gastritis, mild duodenitis  . ESOPHAGOGASTRODUODENOSCOPY (EGD) WITH PROPOFOL N/A 06/04/2015   Dr. Darrick PennaFields: 1. Dyspepsia due to MILD  gastritis and moderate duodenitis 2. Small hiatal hernia. Negative path for H.pylori   . EYE SURGERY     cataract  . MULTIPLE TOOTH EXTRACTIONS      No Known Allergies  Social History   Tobacco Use  . Smoking status: Current Every Day Smoker    Packs/day: 2.00    Types: Cigarettes  . Smokeless tobacco: Never Used  Substance Use Topics  . Alcohol use: No    Alcohol/week: 0.0 standard drinks    Family History  Problem Relation Age of Onset  . Colon cancer Neg Hx    Prior to Admission medications   Medication Sig Start Date End Date Taking? Authorizing Provider  albuterol (PROVENTIL HFA;VENTOLIN HFA) 108 (90 BASE) MCG/ACT inhaler Inhale 2 puffs into the lungs every 4 (four)  hours as needed for wheezing or shortness of breath.    Yes [provider]  amLODipine (NORVASC) 5 MG tablet Take 5 mg by mouth every morning.    Yes [provider]  fluticasone (FLOVENT HFA) 44 MCG/ACT inhaler Inhale 1 puff into the lungs 2 (two) times daily.   Yes [provider]  glimepiride (AMARYL) 1 MG tablet Take 1 mg by mouth daily with breakfast.   Yes [provider]  metFORMIN (GLUCOPHAGE) 1000 MG tablet Take 1,000 mg by mouth 2 (two) times daily with a meal.    Yes [provider]  montelukast (SINGULAIR) 10 MG tablet Take 10 mg by mouth at bedtime. 09/21/18  Yes [provider]  ranitidine (ZANTAC) 150 MG capsule 1 PO BID Monday  THRU FRIDAY Patient taking differently: Take 150 mg by mouth 2 (two) times daily.  08/06/16  Yes Fields, Darleene CleaverSandi L, MD  dicyclomine (BENTYL) 10 MG capsule Take 1 capsule (10 mg total) by mouth 4 (four) times daily as needed for spasms. Patient not taking: Reported on 01/19/2019 08/05/17   Anice PaganiniGill, Eric A, NP  pantoprazole (PROTONIX) 40 MG tablet Take 1 tablet (40 mg total) by mouth 2 (two) times daily before a meal. Patient not taking: Reported on 01/19/2019 08/06/16   West BaliFields, Sandi L, MD  simethicone (GAS-X) 80 MG chewable tablet Chew 1 tablet (80 mg total) by mouth every 6 (six) hours as  needed for flatulence. Patient not taking: Reported on 01/19/2019 02/11/16   Anice Paganini, NP     Review of Systems  Positive ROS: As above  All other systems have been reviewed and were otherwise negative with the exception of those mentioned in the HPI and as above.  Objective: Vital signs in last 24 hours: Temp:  [98.3 F (36.8 C)] 98.3 F (36.8 C) (04/22 0604) Pulse Rate:  [98] 98 (04/22 0604) BP: (188)/(95) 188/95 (04/22 0604) SpO2:  [100 %] 100 % (04/22 0604) Estimated body mass index is 26.44 kg/m as calculated from the following:   Height as of 02/02/19: 5' (1.524 m).   Weight as of 02/02/19: 61.4  kg.   General Appearance: Alert Head: Normocephalic, without obvious abnormality, atraumatic Eyes: PERRL, conjunctiva/corneas clear, EOM's intact,    Ears: Normal  Throat: Normal  Neck: Supple, Back: unremarkable Lungs: Clear to auscultation bilaterally, respirations unlabored Heart: Regular rate and rhythm, no murmur, rub or gallop Abdomen: Soft, non-tender Extremities: Extremities normal, atraumatic, no cyanosis or edema Skin: unremarkable  NEUROLOGIC:   Mental status: alert and oriented,Motor Exam -she has weakness in her bilateral hands. Sensory Exam - grossly normal Reflexes: Hyperreflexic Coordination - grossly normal Gait - grossly normal Balance - grossly normal Cranial Nerves: I: smell Not tested  II: visual acuity  OS: Normal  OD: Normal   II: visual fields Full to confrontation  II: pupils Equal, round, reactive to light  III,VII: ptosis None  III,IV,VI: extraocular muscles  Full ROM  V: mastication Normal  V: facial light touch sensation  Normal  V,VII: corneal reflex  Present  VII: facial muscle function - upper  Normal  VII: facial muscle function - lower Normal  VIII: hearing Not tested  IX: soft palate elevation  Normal  IX,X: gag reflex Present  XI: trapezius strength  5/5  XI: sternocleidomastoid strength 5/5  XI: neck flexion strength  5/5  XII: tongue strength  Normal    Data Review Lab Results  Component Value Date   WBC 6.1 02/02/2019   HGB 13.7 02/02/2019   HCT 42.8 02/02/2019   MCV 83.8 02/02/2019   PLT 306 02/02/2019   Lab Results  Component Value Date   NA 140 02/02/2019   K 4.3 02/02/2019   CL 106 02/02/2019   CO2 25 02/02/2019   BUN 7 02/02/2019   CREATININE 0.73 02/02/2019   GLUCOSE 113 (H) 02/02/2019   No results found for: INR, PROTIME  Assessment/Plan: C4-5 spondylosis, stenosis, myelopathy, cervicalgia: I have discussed the situation with the patient and her son.  I recommended a C4-5 anterior discectomy, fusion and  plating.  I have described the surgery to her.  I have shown her surgical models.  We have discussed the risks, benefits, alternatives, expected postoperative course, and likelihood of achieving our goals with surgery.  I have answered all patient's, and her sons, questions.  She has decided to proceed with surgery.   Cristi Loron 02/09/2019 7:23 AM

## 2019-02-11 ENCOUNTER — Encounter (HOSPITAL_COMMUNITY): Payer: Self-pay | Admitting: Neurosurgery

## 2019-02-14 ENCOUNTER — Other Ambulatory Visit: Payer: Self-pay | Admitting: Neurosurgery

## 2019-06-15 ENCOUNTER — Other Ambulatory Visit: Payer: Self-pay

## 2019-06-15 ENCOUNTER — Ambulatory Visit (HOSPITAL_COMMUNITY): Payer: Medicaid Other | Attending: Neurosurgery | Admitting: Physical Therapy

## 2019-06-15 ENCOUNTER — Encounter (HOSPITAL_COMMUNITY): Payer: Self-pay | Admitting: Physical Therapy

## 2019-06-15 DIAGNOSIS — M542 Cervicalgia: Secondary | ICD-10-CM

## 2019-06-15 DIAGNOSIS — M6281 Muscle weakness (generalized): Secondary | ICD-10-CM | POA: Insufficient documentation

## 2019-06-15 DIAGNOSIS — R2681 Unsteadiness on feet: Secondary | ICD-10-CM | POA: Diagnosis present

## 2019-06-15 DIAGNOSIS — R293 Abnormal posture: Secondary | ICD-10-CM | POA: Diagnosis present

## 2019-06-15 NOTE — Therapy (Signed)
[facility] [address] [place], [place], [address] Phone: [phone]   Fax:  [phone]  Physical Therapy Evaluation  Patient Details  Name: Jessica Kane MRN: [id number] Date of Birth: 01/15/1961 Referring Provider (PT): Dr. Lovell SheehanJenkins    Encounter Date: 06/15/2019  PT End of Session - 06/15/19 1207    Visit Number  1    Number of Visits  4    Date for PT Re-Evaluation  07/13/19    Authorization Type  Medicaid (initial cert submitted 06/15/19)    Authorization Time Period  06/15/19 to 07/16/19    Authorization - Visit Number  1    Authorization - Number of Visits  10    PT Start Time  1111    PT Stop Time  1151    PT Time Calculation (min)  40 min    Activity Tolerance  Patient tolerated treatment well    Behavior During Therapy  [facility]       Past Medical History:  Diagnosis Date  . Asthma   . Essential hypertension   . Gastritis and duodenitis   . GERD (gastroesophageal reflux disease)   . Internal hemorrhoids   . Type 2 diabetes mellitus (HCC)     Past Surgical History:  Procedure Laterality Date  . ANTERIOR CERVICAL DECOMP/DISCECTOMY FUSION N/A 02/09/2019   Procedure: Cervical four-five Anterior cervical decompression/discectomy/fusion/interbody prosthesis/plate/screws;  Surgeon: Tressie StalkerJenkins, Jeffrey, MD;  [facility];  Service: Neurosurgery;  Laterality: N/A;  . BIOPSY N/A 06/04/2015   Procedure: GASTRIC BIOPSIES;  Surgeon: West BaliSandi L Fields, MD;  Location: [facility]S;  Service: Endoscopy;  Laterality: N/A;  . CESAREAN SECTION    . COLONOSCOPY  2007   Dr. Allena KatzPatel: mild diverticulosis  . COLONOSCOPY WITH PROPOFOL N/A 06/04/2015   Dr. Darrick PennaFields: 1. the left colon is redundant 2. Small internal hemorrhoids 3. The  colonici mucosa appeared normal> next colonscopy in 10 years  . ESOPHAGOGASTRODUODENOSCOPY  2006   Dr. Allena KatzPatel: mild to moderate diffuse gastritis, mild duodenitis  . ESOPHAGOGASTRODUODENOSCOPY (EGD)  WITH PROPOFOL N/A 06/04/2015   Dr. Darrick PennaFields: 1. Dyspepsia due to MILD  gastritis and moderate duodenitis 2. Small hiatal hernia. Negative path for H.pylori   . EYE SURGERY     cataract  . MULTIPLE TOOTH EXTRACTIONS      There were no vitals filed for this visit.   Subjective Assessment - 06/15/19 1114    Subjective  Son reports that she was having some deterioration in her neck that was putting pressure on her spinal cord and she was getting a lot of pain. She still has pinched nerves. No numbness or tingling going down arms. Lots of issues with droping items and with grip. Has had some issues with loss of balance as well as ongoing neck pain. She had a checkup last week, son reporst surgeon has cleared her of precautions.    How Toppin can you sit comfortably?  mostly hurts when sleeping    How Metivier can you stand comfortably?  mostly hurts when sleeping    How Larner can you walk comfortably?  mostly hurts when sleeping    Patient Stated Goals  get pain down, figure out how to relieve tension in neck    Currently in Pain?  Yes    Pain Score  6     Pain Location  Neck    Pain Orientation  Left    Pain Descriptors / Indicators  Sharp    Pain  Type  Chronic pain    Pain Radiating Towards  L elbow    Pain Onset  More than a month ago    Pain Frequency  Intermittent    Aggravating Factors   unsure, sleeping can hurt her more    Pain Relieving Factors  cotton neck brace    Effect of Pain on Daily Activities  moderate         [facility] PT Assessment - 06/15/19 0001      Assessment   Medical Diagnosis  neck pain     Referring Provider (PT)  Dr. Lovell Sheehan     Onset Date/Surgical Date  --   chronic/surgery in April    Next MD Visit  Dr. Lovell Sheehan in February     Prior Therapy  unsure       Precautions   Precautions  None      Restrictions   Weight Bearing Restrictions  No      Balance Screen   Has the patient fallen in the past 6 months  Yes    How many times?  "few", not too frequent, once  every 2-3 months      Has the patient had a decrease in activity level because of a fear of falling?   Yes    Is the patient reluctant to leave their home because of a fear of falling?   No      Home Environment   Living Environment  Private residence    Additional Comments  stays with mother and son; no steps       Prior Function   Level of Independence  Independent;Independent with basic ADLs    Vocation  Retired    Leisure  Teacher, English as a foreign language / Strength   AROM / PROM / Strength  AROM;Strength      AROM   AROM Assessment Site  Shoulder;Cervical;Thoracic    Right/Left Shoulder  Right;Left    Right Shoulder Flexion  --   moderate limitaiton B    Right Shoulder ABduction  --   mild limitation B    Right Shoulder Internal Rotation  --   L2    Right Shoulder External Rotation  --   T3    Left Shoulder Flexion  --   moderate limitation B    Left Shoulder ABduction  --   mild limitation B    Left Shoulder Internal Rotation  --   L2    Left Shoulder External Rotation  --   T3    Cervical Flexion  50   painful    Cervical Extension  18   painful    Cervical - Right Side Bend  30    Cervical - Left Side Bend  30    Cervical - Right Rotation  65    Cervical - Left Rotation  70    Thoracic Flexion  WNL, kyphotic     Thoracic Extension  moderate limitation     Thoracic - Right Side Bend  moderate limitation     Thoracic - Left Side Bend  moderate limitation     Thoracic - Right Rotation  severe limitation    Thoracic - Left Rotation  mild limitation       Strength   Overall Strength Comments  L hand grip moderate weaker than R via manual testing     Strength Assessment Site  Shoulder;Elbow;Wrist;Hand    Right/Left Shoulder  Right;Left    Right Shoulder Flexion  4/5    Right Shoulder ABduction  3+/5    Right Shoulder Internal Rotation  4+/5    Right Shoulder External Rotation  4+/5    Left Shoulder Flexion  4/5    Left Shoulder ABduction  3+/5    Left Shoulder  Internal Rotation  4+/5    Left Shoulder External Rotation  4+/5    Right/Left Elbow  Right;Left    Right Elbow Flexion  4+/5    Right Elbow Extension  3+/5    Left Elbow Flexion  4/5    Left Elbow Extension  3/5    Right/Left Wrist  Right;Left    Right Wrist Flexion  4+/5    Right Wrist Extension  4+/5    Left Wrist Flexion  4/5    Left Wrist Extension  4+/5      Palpation   Palpation comment  multiple areas of muscle spasms and knotting, very TTP and patient very anxious about these areas       Standardized Balance Assessment   Standardized Balance Assessment  Dynamic Gait Index;Timed Up and Go Test      Timed Up and Go Test   Normal TUG (seconds)  20    TUG Comments  minA for balance on turn, no device                 Objective measurements completed on examination: See above findings.              PT Education - 06/15/19 1201    Education Details  exam findings, prognosis, POC, HEP, medicaid rules/regulations  for scheduling    Person(s) Educated  Patient;Child(ren)    Methods  Explanation;Demonstration;Handout    Comprehension  Verbalized understanding;Need further instruction;Verbal cues required       PT Short Term Goals - 06/15/19 1217      PT SHORT TERM GOAL #1   Title  Patient to be compliant with appropriate HEP, to be updated PRN    Time  1    Period  Weeks    Status  New    Target Date  06/22/19        PT Antonucci Term Goals - 06/15/19 1218      PT Kathan TERM GOAL #1   Title  Patient to show 10 degree improvement all cervical planes of motion in order to reduce pain and fascial restrictions    Time  3    Period  Weeks    Status  New    Target Date  07/06/19      PT Vazques TERM GOAL #2   Title  Patient to be able to correct posture without external cues at least 50% of the time in order to assist in pain reduction    Time  3    Period  Weeks    Status  New      PT Abram TERM GOAL #3   Title  Patient to be able to tolerate light  pressure for STM/IASTM in order to allow therapy staff to further address muscle knotting and spasm    Time  3    Period  Weeks    Status  New             Plan - 06/15/19 1210    Clinical Impression Statement  Ms. Furry arrives with her son, who was present during session and assisted with communication during this evaluation. He confirms that she had ACDF procedure in April 2020 and that  at this point the surgeon has lifted surgical precautions; she has continued to have neck pain and they felt it best to try therapy at this point. Examination reveals significant postural limitations, severe and very tender muscle knotting and spasm as well as hypersensitive patterns especially around scar line anterior L neck, functional muscle weakness including reduced grip strength, reduced cervical ROM, and impaired functional balance skills. Patient very anxious during session requiring regular reassurance from son during care today. Recommend trial of skilled PT services to address functional deficits and attempt to reduce pain moving forward.    Personal Factors and Comorbidities  Age;Fitness;Behavior Pattern;Comorbidity 3+;Education;Past/Current Experience;Social Background;Time since onset of injury/illness/exacerbation;Finances;Transportation    Comorbidities  hx of cervical spondylosis with myelopathy and radiculopathy, DM, HTN, ACDF 01/2019    Examination-Activity Limitations  Bed Mobility;Reach Overhead;Caring for Others;Sleep;Dressing;Hygiene/Grooming;Lift    Examination-Participation Restrictions  Church;Yard Work;Cleaning;Meal Prep;Community Activity;Shop    Stability/Clinical Decision Making  Evolving/Moderate complexity    Clinical Decision Making  Moderate    Rehab Potential  Fair    PT Frequency  1x / week    PT Duration  3 weeks    PT Treatment/Interventions  ADLs/Self Care Home Management;Cryotherapy;Electrical Stimulation;Moist Heat;Ultrasound;Functional mobility training;Therapeutic  activities;Therapeutic exercise;Balance training;Neuromuscular re-education;Patient/family education;Manual techniques;Passive range of motion;Dry needling;Taping    PT Next Visit Plan  OK for son to come back to help with communication with patient. Review HEP, work on desensitization, manual as tolerated, exercises and postural training as tolerated. Trial moist heat.    PT Home Exercise Plan  Eval: cervical ROM all planes, desensitization progressive routine    Consulted and Agree with Plan of Care  Patient;Family member/caregiver    Family Member Consulted  son       Patient will benefit from skilled therapeutic intervention in order to improve the following deficits and impairments:  Decreased range of motion, Increased fascial restricitons, Decreased safety awareness, Increased muscle spasms, Impaired UE functional use, Pain, Decreased balance, Decreased scar mobility, Hypomobility, Improper body mechanics, Impaired flexibility, Decreased strength, Postural dysfunction  Visit Diagnosis: Cervicalgia - Plan: PT plan of care cert/re-cert  Muscle weakness (generalized) - Plan: PT plan of care cert/re-cert  Abnormal posture - Plan: PT plan of care cert/re-cert  Unsteadiness on feet - Plan: PT plan of care cert/re-cert     Problem List Patient Active Problem List   Diagnosis Date Noted  . Cervical spondylosis with myelopathy and radiculopathy 02/09/2019  . Rectal bleeding 08/05/2017  . Diarrhea 08/05/2017  . Abnormal chest CT 08/05/2017  . Elevated blood pressure reading 01/26/2017  . Nausea with vomiting 10/03/2015  . GERD (gastroesophageal reflux disease) 04/14/2015    Deniece Ree PT, DPT, CBIS  Supplemental Physical Therapist [place]    Pager [phone] Acute Rehab Office [place] [address] [place], [place], [address] Phone: [phone]   Fax:  [phone]  Name: Jessica Kane MRN:  [id number] Date of Birth: 01-21-1961

## 2019-06-15 NOTE — Patient Instructions (Signed)
   Sitting upright and with good posture, practice looking up and down with your neck, just as far as you can tolerate.  Repeat 10 times, 2 -3 times per day.     CERVICAL ROTATION  Turn your head towards the side, then return back to looking straight ahead.  Repeat 10 times each side, 2-3 times per day.    Seated Cervical Lateral Flexion  Sit up tall in a chair. Keeping your shoulders down and your eyes forward tilt your head towards one shoulder.  Repeat 10 times each side, 2-3 times per day.      Scar/Incision Desensitization  Use cotton ball/tissue to brush lightly over scar/incision for desensitization.  Make sure you are rubbing over the scar line specifically, also rub over the back of the neck and shoulders.  When a cotton ball/tissue feels comfortable, progress to a washcloth.  When a washcloth feels comfortable, progress to light pressure.  Repeat for 3-5 minutes, twice a day.

## 2019-06-22 ENCOUNTER — Ambulatory Visit (HOSPITAL_COMMUNITY): Payer: Medicaid Other | Attending: Neurosurgery

## 2019-06-22 ENCOUNTER — Encounter (HOSPITAL_COMMUNITY): Payer: Self-pay

## 2019-06-22 ENCOUNTER — Other Ambulatory Visit: Payer: Self-pay

## 2019-06-22 DIAGNOSIS — M542 Cervicalgia: Secondary | ICD-10-CM | POA: Diagnosis present

## 2019-06-22 DIAGNOSIS — R2681 Unsteadiness on feet: Secondary | ICD-10-CM | POA: Insufficient documentation

## 2019-06-22 DIAGNOSIS — M6281 Muscle weakness (generalized): Secondary | ICD-10-CM | POA: Diagnosis present

## 2019-06-22 DIAGNOSIS — R293 Abnormal posture: Secondary | ICD-10-CM | POA: Diagnosis present

## 2019-06-22 NOTE — Therapy (Signed)
[place], [place], [address] Phone: [phone]   Fax:  [phone]  Physical Therapy Treatment  Patient Details  Name: Jessica Kane MRN: [id number] Date of Birth: [birth year] Referring Provider (PT): Dr. Arnoldo Morale    Encounter Date: 06/22/2019  PT End of Session - 06/22/19 1133    Visit Number  2    Number of Visits  4    Date for PT Re-Evaluation  07/13/19    Authorization Type  Medicaid (initial cert submitted 5/42/70)  Medicaid approved authorization 8/27-->07/06/19    Authorization Time Period  06/15/19 to 07/16/19    Authorization - Visit Number  2    Authorization - Number of Visits  10    PT Start Time  6237    PT Stop Time  1210    PT Time Calculation (min)  39 min    Activity Tolerance  Patient tolerated treatment well    Behavior During Therapy  [facility] for tasks assessed/performed       Past Medical History:  Diagnosis Date  . Asthma   . Essential hypertension   . Gastritis and duodenitis   . GERD (gastroesophageal reflux disease)   . Internal hemorrhoids   . Type 2 diabetes mellitus ([place])     Past Surgical History:  Procedure Laterality Date  . ANTERIOR CERVICAL DECOMP/DISCECTOMY FUSION N/A 02/09/2019   Procedure: Cervical four-five Anterior cervical decompression/discectomy/fusion/interbody prosthesis/plate/screws;  Surgeon: Newman Pies, MD;  Location: [place];  Service: Neurosurgery;  Laterality: N/A;  . BIOPSY N/A 06/04/2015   Procedure: GASTRIC BIOPSIES;  Surgeon: Danie Binder, MD;  Location: [facility];  Service: Endoscopy;  Laterality: N/A;  . CESAREAN SECTION    . COLONOSCOPY  2007   Dr. Posey Pronto: mild diverticulosis  . COLONOSCOPY WITH PROPOFOL N/A 06/04/2015   Dr. Oneida Alar: 1. the left colon is redundant 2. Small internal hemorrhoids 3. The  colonici mucosa appeared normal> next colonscopy in 10 years  . ESOPHAGOGASTRODUODENOSCOPY  2006   Dr. Posey Pronto: mild to moderate diffuse gastritis, mild  duodenitis  . ESOPHAGOGASTRODUODENOSCOPY (EGD) WITH PROPOFOL N/A 06/04/2015   Dr. Oneida Alar: 1. Dyspepsia due to MILD  gastritis and moderate duodenitis 2. Small hiatal hernia. Negative path for H.pylori   . EYE SURGERY     cataract  . MULTIPLE TOOTH EXTRACTIONS      There were no vitals filed for this visit.  Subjective Assessment - 06/22/19 1125    Subjective  Pt arrived wiht son.  Reports she is feeling good today, no reoprts of pain.  Has began the exercises at home.    Patient Stated Goals  get pain down, figure out how to relieve tension in neck    Currently in Pain?  No/denies         [facility] PT Assessment - 06/22/19 0001      Assessment   Medical Diagnosis  neck pain     Referring Provider (PT)  Dr. Arnoldo Morale     Onset Date/Surgical Date  02/09/19   Surgery   Next MD Visit  Dr. Arnoldo Morale in February     Prior Therapy  unsure                    [facility] Adult PT Treatment/Exercise - 06/22/19 0001      Exercises   Exercises  Neck      Neck Exercises: Seated   Neck Retraction  5 reps    Neck Retraction Limitations  max  verbal and tactile cueing    W Back  10 reps    Shoulder Rolls  Backwards;10 reps    Shoulder Rolls Limitations  cueing for mechanics shoulders up, back and down    Postural Training  Educated importance of posture    Other Seated Exercise  3D cervical excursion; 3D thoracic excursion    Other Seated Exercise  scapular retraciotn      Modalities   Modalities  Moist Heat      Moist Heat Therapy   Number Minutes Moist Heat  8 Minutes    Moist Heat Location  Cervical      Manual Therapy   Manual Therapy  Soft tissue mobilization    Manual therapy comments  Manual complete separate than rest of tx    Soft tissue mobilization  Supine position with STM to UT, levator scap and posterior cervical      Neck Exercises: Stretches   Upper Trapezius Stretch  2 reps;30 seconds             PT Education - 06/22/19 1133    Education Details   Reviewed goals.  Educated importance of compliance and assured correct form.  Cueing to improve mechanics and awareness of posture during exercise       PT Short Term Goals - 06/15/19 1217      PT SHORT TERM GOAL #1   Title  Patient to be compliant with appropriate HEP, to be updated PRN    Time  1    Period  Weeks    Status  New    Target Date  06/22/19        PT Packman Term Goals - 06/15/19 1218      PT Harville TERM GOAL #1   Title  Patient to show 10 degree improvement all cervical planes of motion in order to reduce pain and fascial restrictions    Time  3    Period  Weeks    Status  New    Target Date  07/06/19      PT Glaeser TERM GOAL #2   Title  Patient to be able to correct posture without external cues at least 50% of the time in order to assist in pain reduction    Time  3    Period  Weeks    Status  New      PT Salay TERM GOAL #3   Title  Patient to be able to tolerate light pressure for STM/IASTM in order to allow therapy staff to further address muscle knotting and spasm    Time  3    Period  Weeks    Status  New            Plan - 06/22/19 1235    Clinical Impression Statement  Pt arrived with son who sat through session and assisted with form to improve carry-over at home.  Reviewed goals and assured compliance with HEP.  Pt educated on importance of compliance for maximal benefits also reviewed form to improve cervical ROM and reduce whole body movements.  Educated importance of posture to reduce strain on cervical mm.  Therex focus on cervical mobility and posture strengthening.  Pt required max cueing with chin tucks, continue to educate proper form.  No reports of pain through session.  EOS with manual soft tissue mobilizaiton to address tightness on cervical musculature.    Personal Factors and Comorbidities  Age;Fitness;Behavior Pattern;Comorbidity 3+;Education;Past/Current Experience;Social Background;Time since onset of  injury/illness/exacerbation;Finances;Transportation  Comorbidities  hx of cervical spondylosis with myelopathy and radiculopathy, DM, HTN, ACDF 01/2019    Examination-Activity Limitations  Bed Mobility;Reach Overhead;Caring for Others;Sleep;Dressing;Hygiene/Grooming;Lift    Examination-Participation Restrictions  Church;Yard Work;Cleaning;Meal Prep;Community Activity;Shop    Stability/Clinical Decision Making  Evolving/Moderate complexity    Clinical Decision Making  Moderate    Rehab Potential  Fair    PT Frequency  1x / week    PT Duration  3 weeks    PT Treatment/Interventions  ADLs/Self Care Home Management;Cryotherapy;Electrical Stimulation;Moist Heat;Ultrasound;Functional mobility training;Therapeutic activities;Therapeutic exercise;Balance training;Neuromuscular re-education;Patient/family education;Manual techniques;Passive range of motion;Dry needling;Taping    PT Next Visit Plan  OK for son to come back to help with communication with patient. Review HEP, work on desensitization, manual as tolerated, exercises and postural training as tolerated. Trial moist heat.    PT Home Exercise Plan  Eval: cervical ROM all planes, desensitization progressive routine; 06/22/19:  improve awareness of posture, sitting tall       Patient will benefit from skilled therapeutic intervention in order to improve the following deficits and impairments:  Decreased range of motion, Increased fascial restricitons, Decreased safety awareness, Increased muscle spasms, Impaired UE functional use, Pain, Decreased balance, Decreased scar mobility, Hypomobility, Improper body mechanics, Impaired flexibility, Decreased strength, Postural dysfunction  Visit Diagnosis: Cervicalgia  Muscle weakness (generalized)  Abnormal posture  Unsteadiness on feet     Problem List Patient Active Problem List   Diagnosis Date Noted  . Cervical spondylosis with myelopathy and radiculopathy 02/09/2019  . Rectal bleeding  08/05/2017  . Diarrhea 08/05/2017  . Abnormal chest CT 08/05/2017  . Elevated blood pressure reading 01/26/2017  . Nausea with vomiting 10/03/2015  . GERD (gastroesophageal reflux disease) 04/14/2015   Becky Sax, LPTA; CBIS [phone]  Juel Burrow 06/22/2019, 12:42 PM  [place] [facility] [address] [place], [place], [address] Phone: [phone]   Fax:  [phone]  Name: KHALEYA AWADALLA MRN: [id number] Date of Birth: [birth year]

## 2019-06-29 ENCOUNTER — Other Ambulatory Visit: Payer: Self-pay

## 2019-06-29 ENCOUNTER — Encounter (HOSPITAL_COMMUNITY): Payer: Self-pay

## 2019-06-29 ENCOUNTER — Ambulatory Visit (HOSPITAL_COMMUNITY): Payer: Medicaid Other

## 2019-06-29 DIAGNOSIS — M542 Cervicalgia: Secondary | ICD-10-CM | POA: Diagnosis not present

## 2019-06-29 DIAGNOSIS — R293 Abnormal posture: Secondary | ICD-10-CM

## 2019-06-29 DIAGNOSIS — M6281 Muscle weakness (generalized): Secondary | ICD-10-CM

## 2019-06-29 DIAGNOSIS — R2681 Unsteadiness on feet: Secondary | ICD-10-CM

## 2019-06-29 NOTE — Patient Instructions (Signed)
Scapular Retraction: Elbow Flexion (Standing) ° ° ° °With elbows bent to 90°, pinch shoulder blades together and rotate arms out, keeping elbows bent. °Repeat 10 times per set. Do 2 sets per session.  ° °http://orth.exer.us/949  ° °Copyright © [employer]. All rights reserved.  ° °

## 2019-06-29 NOTE — Therapy (Signed)
Jessica Kane, [place], [address] Phone: [phone]   Fax:  [phone]  Physical Therapy Treatment  Patient Details  Name: Jessica Kane MRN: [id number] Date of Birth: 07-15-1961 Referring Provider (PT): Dr. Arnoldo Morale    Encounter Date: 06/29/2019  PT End of Session - 06/29/19 1223    Visit Number  3    Number of Visits  4    Date for PT Re-Evaluation  07/13/19    Authorization Type  Medicaid (initial cert submitted 06/19/50)  Medicaid approved authorization3 visits 8/27-->07/06/19    Authorization Time Period  06/15/19 to 07/16/19    Authorization - Visit Number  3    Authorization - Number of Visits  10    PT Start Time  1133    PT Stop Time  1213    PT Time Calculation (min)  40 min    Activity Tolerance  Patient tolerated treatment well    Behavior During Therapy  [facility] for tasks assessed/performed       Past Medical History:  Diagnosis Date  . Asthma   . Essential hypertension   . Gastritis and duodenitis   . GERD (gastroesophageal reflux disease)   . Internal hemorrhoids   . Type 2 diabetes mellitus ([place])     Past Surgical History:  Procedure Laterality Date  . ANTERIOR CERVICAL DECOMP/DISCECTOMY FUSION N/A 02/09/2019   Procedure: Cervical four-five Anterior cervical decompression/discectomy/fusion/interbody prosthesis/plate/screws;  Surgeon: Newman Pies, MD;  Location: [facility];  Service: Neurosurgery;  Laterality: N/A;  . BIOPSY N/A 06/04/2015   Procedure: GASTRIC BIOPSIES;  Surgeon: Danie Binder, MD;  Location: [facility];  Service: Endoscopy;  Laterality: N/A;  . CESAREAN SECTION    . COLONOSCOPY  2007   Dr. Posey Pronto: mild diverticulosis  . COLONOSCOPY WITH PROPOFOL N/A 06/04/2015   Dr. Oneida Alar: 1. the left colon is redundant 2. Small internal hemorrhoids 3. The  colonici mucosa appeared normal> next colonscopy in 10 years  . ESOPHAGOGASTRODUODENOSCOPY  2006   Dr. Posey Pronto: mild to moderate diffuse gastritis,  mild duodenitis  . ESOPHAGOGASTRODUODENOSCOPY (EGD) WITH PROPOFOL N/A 06/04/2015   Dr. Oneida Alar: 1. Dyspepsia due to MILD  gastritis and moderate duodenitis 2. Small hiatal hernia. Negative path for H.pylori   . EYE SURGERY     cataract  . MULTIPLE TOOTH EXTRACTIONS      There were no vitals filed for this visit.                    [facility] Adult PT Treatment/Exercise - 06/29/19 0001      Exercises   Exercises  Neck      Neck Exercises: Theraband   Shoulder Extension  10 reps;Red    Shoulder Extension Limitations  2 sets 5" holds max verbal and tactile cueing for form/mechanics    Rows  10 reps;Red    Rows Limitations  verbal and tactile cueing      Neck Exercises: Standing   Other Standing Exercises  Y at the wall 10x      Neck Exercises: Seated   W Back  10 reps    Shoulder Rolls  Backwards;10 reps    Shoulder Rolls Limitations  cueing for mechanics shoulders up, back and down    Other Seated Exercise  3D cervical excursion 10x each    Other Seated Exercise  scapular retraciotn      Neck Exercises: Supine   Neck Retraction  5 reps;3 secs    Neck  Retraction Limitations  verbal and tactile cueing      Modalities   Modalities  Moist Heat      Moist Heat Therapy   Number Minutes Moist Heat  88 Minutes    Moist Heat Location  Cervical      Manual Therapy   Manual Therapy  Soft tissue mobilization    Manual therapy comments  Manual complete separate than rest of tx    Soft tissue mobilization  Supine position with STM to UT, levator scap and posterior cervical      Neck Exercises: Stretches   Upper Trapezius Stretch  2 reps;30 seconds    Levator Stretch  2 reps;30 seconds               PT Short Term Goals - 06/15/19 1217      PT SHORT TERM GOAL #1   Title  Patient to be compliant with appropriate HEP, to be updated PRN    Time  1    Period  Weeks    Status  New    Target Date  06/22/19        PT [place] Term Goals - 06/15/19 1218      PT  Geerts TERM GOAL #1   Title  Patient to show 10 degree improvement all cervical planes of motion in order to reduce pain and fascial restrictions    Time  3    Period  Weeks    Status  New    Target Date  07/06/19      PT Buskey TERM GOAL #2   Title  Patient to be able to correct posture without external cues at least 50% of the time in order to assist in pain reduction    Time  3    Period  Weeks    Status  New      PT Charters TERM GOAL #3   Title  Patient to be able to tolerate light pressure for STM/IASTM in order to allow therapy staff to further address muscle knotting and spasm    Time  3    Period  Weeks    Status  New            Plan - 06/29/19 1226    Clinical Impression Statement  Reviewed compliance with HEP, pt able to complete all movement though required cueing to slow down through session and to improve awareness of posture during exercises.  Added MHP duirng 3D cervical exercusion for mobility as well as additional stretches to improve ROM.  Added supine cervical retraction wiht max verbal and tactile cueing for proper mechanics (needs more cueing here).  Did add theraband for strenghtening postureal mm with max cueing for form and to slow down.  No reoprts of pain through session.  EOS with manual soft tissue mobilization to address cervical restricitons.    Personal Factors and Comorbidities  Age;Fitness;Behavior Pattern;Comorbidity 3+;Education;Past/Current Experience;Social Background;Time since onset of injury/illness/exacerbation;Finances;Transportation    Comorbidities  hx of cervical spondylosis with myelopathy and radiculopathy, DM, HTN, ACDF 01/2019    Examination-Activity Limitations  Bed Mobility;Reach Overhead;Caring for Others;Sleep;Dressing;Hygiene/Grooming;Lift    Examination-Participation Restrictions  Church;Yard Work;Cleaning;Meal Prep;Community Activity;Shop    Stability/Clinical Decision Making  Evolving/Moderate complexity    Clinical Decision Making   Moderate    Rehab Potential  Fair    PT Frequency  1x / week    PT Duration  3 weeks    PT Treatment/Interventions  ADLs/Self Care Home Management;Cryotherapy;Electrical Stimulation;Moist Heat;Ultrasound;Functional mobility training;Therapeutic  activities;Therapeutic exercise;Balance training;Neuromuscular re-education;Patient/family education;Manual techniques;Passive range of motion;Dry needling;Taping    PT Next Visit Plan  Review goals next session for 3rd Medicaid session.  OK for son to come back to help with communication and carry-over with pt.    PT Home Exercise Plan  Eval: cervical ROM all planes, desensitization progressive routine; 06/22/19:  improve awareness of posture, sitting tall; 9/9: rows       Patient will benefit from skilled therapeutic intervention in order to improve the following deficits and impairments:  Decreased range of motion, Increased fascial restricitons, Decreased safety awareness, Increased muscle spasms, Impaired UE functional use, Pain, Decreased balance, Decreased scar mobility, Hypomobility, Improper body mechanics, Impaired flexibility, Decreased strength, Postural dysfunction  Visit Diagnosis: Cervicalgia  Muscle weakness (generalized)  Abnormal posture  Unsteadiness on feet     Problem List Patient Active Problem List   Diagnosis Date Noted  . Cervical spondylosis with myelopathy and radiculopathy 02/09/2019  . Rectal bleeding 08/05/2017  . Diarrhea 08/05/2017  . Abnormal chest CT 08/05/2017  . Elevated blood pressure reading 01/26/2017  . Nausea with vomiting 10/03/2015  . GERD (gastroesophageal reflux disease) 04/14/2015   Becky Saxasey Maize Brittingham, LPTA; CBIS [phone]  Juel BurrowCockerham, Brytney Somes Jo 06/29/2019, 5:34 PM  [facility] [address] [place], [place], [address] Phone: [phone]   Fax:  [phone]  Name: Jessica Kane MRN: [id number] Date of Birth: December 10, 1960

## 2019-07-05 ENCOUNTER — Telehealth (HOSPITAL_COMMUNITY): Payer: Self-pay

## 2019-07-05 NOTE — Telephone Encounter (Signed)
Pt called  needed to come at 11:15 b/c of transportation or she would have to cx. Pt was worked in at 11:15

## 2019-07-06 ENCOUNTER — Other Ambulatory Visit: Payer: Self-pay

## 2019-07-06 ENCOUNTER — Encounter (HOSPITAL_COMMUNITY): Payer: Self-pay

## 2019-07-06 ENCOUNTER — Ambulatory Visit (HOSPITAL_COMMUNITY): Payer: Medicaid Other

## 2019-07-06 DIAGNOSIS — R293 Abnormal posture: Secondary | ICD-10-CM

## 2019-07-06 DIAGNOSIS — M6281 Muscle weakness (generalized): Secondary | ICD-10-CM

## 2019-07-06 DIAGNOSIS — M542 Cervicalgia: Secondary | ICD-10-CM

## 2019-07-06 DIAGNOSIS — R2681 Unsteadiness on feet: Secondary | ICD-10-CM

## 2019-07-06 NOTE — Therapy (Signed)
[facility] [address] [place], [place], [address] Phone: [phone]   Fax:  [phone]   PHYSICAL THERAPY DISCHARGE SUMMARY  Visits from Start of Care: 4  Current functional level related to goals / functional outcomes: See below   Remaining deficits: See below   Education / Equipment: See below  Plan: Patient agrees to discharge.  Patient goals were not met. Patient is being discharged due to being pleased with the current functional level.  ?????       Physical Therapy Treatment  Patient Details  Name: Jessica Kane MRN: [id number] Date of Birth: 01/10/1961 Referring Provider (PT): Dr. Arnoldo Morale    Encounter Date: 07/06/2019  PT End of Session - 07/06/19 1113    Visit Number  4    Number of Visits  4    Date for PT Re-Evaluation  07/13/19    Authorization Type  Medicaid (initial cert submitted 6/96/29)  Medicaid approved authorization3 visits 8/27-->07/06/19    Authorization Time Period  06/15/19 to 07/16/19    Authorization - Visit Number  4    Authorization - Number of Visits  10    PT Start Time  1115    PT Stop Time  1138    PT Time Calculation (min)  23 min    Activity Tolerance  Patient tolerated treatment well    Behavior During Therapy  [facility] for tasks assessed/performed       Past Medical History:  Diagnosis Date  . Asthma   . Essential hypertension   . Gastritis and duodenitis   . GERD (gastroesophageal reflux disease)   . Internal hemorrhoids   . Type 2 diabetes mellitus ([place])     Past Surgical History:  Procedure Laterality Date  . ANTERIOR CERVICAL DECOMP/DISCECTOMY FUSION N/A 02/09/2019   Procedure: Cervical four-five Anterior cervical decompression/discectomy/fusion/interbody prosthesis/plate/screws;  Surgeon: Newman Pies, MD;  Location: [facility];  Service: Neurosurgery;  Laterality: N/A;  . BIOPSY N/A 06/04/2015   Procedure: GASTRIC BIOPSIES;  Surgeon: Danie Binder, MD;  Location[facility];   Service: Endoscopy;  Laterality: N/A;  . CESAREAN SECTION    . COLONOSCOPY  2007   Dr. Posey Pronto: mild diverticulosis  . COLONOSCOPY WITH PROPOFOL N/A 06/04/2015   Dr. Oneida Alar: 1. the left colon is redundant 2. Small internal hemorrhoids 3. The  colonici mucosa appeared normal> next colonscopy in 10 years  . ESOPHAGOGASTRODUODENOSCOPY  2006   Dr. Posey Pronto: mild to moderate diffuse gastritis, mild duodenitis  . ESOPHAGOGASTRODUODENOSCOPY (EGD) WITH PROPOFOL N/A 06/04/2015   Dr. Oneida Alar: 1. Dyspepsia due to MILD  gastritis and moderate duodenitis 2. Small hiatal hernia. Negative path for H.pylori   . EYE SURGERY     cataract  . MULTIPLE TOOTH EXTRACTIONS      There were no vitals filed for this visit.  Subjective Assessment - 07/06/19 1118    Subjective  Pt feels like she is doing "alright: since starting therapy. Pt denies difficulty with activities at home. Pt's son reports she does exercises 1x/week. Pt reports no pain with sleeping, sitting, standing or walking; pt reports no issues at all anymore.    How K[place] can you sit comfortably?  no issues    How See[place] can you stand comfortably?  no issues    How [place] can you walk comfortably?  no issues    Patient Stated Goals  get pain down, figure out how to relieve tension in neck    Currently in Pain?  No/denies  [facility] PT Assessment - 07/06/19 0001      Assessment   Medical Diagnosis  neck pain     Referring Provider (PT)  Dr. Arnoldo Morale     Onset Date/Surgical Date  02/09/19   Surgery   Next MD Visit  Dr. Arnoldo Morale in February     Prior Therapy  unsure       Precautions   Precautions  None      Restrictions   Weight Bearing Restrictions  No      Balance Screen   Has the patient fallen in the past 6 months  No    How many times?  none since starting therapy    Has the patient had a decrease in activity level because of a fear of falling?   No    Is the patient reluctant to leave their home because of a fear of falling?   No       ROM / Strength   AROM / PROM / Strength  AROM;Strength      AROM   Cervical Flexion  50   was 50   Cervical Extension  30   was 18   Cervical - Right Side Bend  35   was 30   Cervical - Left Side Bend  35   was 30   Cervical - Right Rotation  80   was 65   Cervical - Left Rotation  80   was 70     Strength   Overall Strength Comments  L hand grip mildly weaker than R via manual testing   was L hand grip moderate weaker than R via manual testing    Right Shoulder Flexion  4/5   was 4   Right Shoulder ABduction  3+/5   was 3+   Right Shoulder Internal Rotation  4+/5   was 4+   Right Shoulder External Rotation  4+/5   was 4+   Left Shoulder Flexion  4/5   was 4   Left Shoulder ABduction  3+/5   was 3+   Left Shoulder Internal Rotation  4+/5   was 4+   Left Shoulder External Rotation  4+/5   was 4+     Palpation   Palpation comment  palpable restrictions throughout upper trap and cervical/thoracic paraspinals, denies TTP but demonstrates facial wincing             PT Education - 07/06/19 1145    Education Details  Educated pt on reassessment for medicaid authorization, benefits of PT to improve deficits, consistentcy with HEP performance    Person(s) Educated  Patient;Child(ren)    Methods  Explanation    Comprehension  Verbalized understanding       PT Short Term Goals - 07/06/19 1120      PT SHORT TERM GOAL #1   Title  Patient to be compliant with appropriate HEP, to be updated PRN    Baseline  9/16: reports 1x/week    Time  1    Period  Weeks    Status  On-going    Target Date  06/22/19        PT Trim Term Goals - 07/06/19 1120      PT Crombie TERM GOAL #1   Title  Patient to show 10 degree improvement all cervical planes of motion in order to reduce pain and fascial restrictions    Baseline  9/16: see ROM    Time  3    Period  Weeks  Status  On-going      PT Arch TERM GOAL #2   Title  Patient to be able to correct posture without external  cues at least 50% of the time in order to assist in pain reduction    Baseline  9/16: son reports no change    Time  3    Period  Weeks    Status  On-going      PT Fabre TERM GOAL #3   Title  Patient to be able to tolerate light pressure for STM/IASTM in order to allow therapy staff to further address muscle knotting and spasm    Baseline  9/16: pt denies pain, but with facial wincing so difficult to determine    Time  3    Period  Weeks    Status  On-going            Plan - 07/06/19 1120    Clinical Impression Statement  Pt due for mini reassess this date. Objectively, pt continues to be limited in cervical AROM, with most limited range being into extension. Pt with facial wincing and hand movement throughout session demonstrating tense and nervous/anxious posture continues to move hands, shoulders elevated, and occasional facial wincing at rest and with palpation. Objectively, pt with increased palpable restrictions throughout bil upper traps and thoracic paraspinals, but son reports her normal posture. Pt denies pain/tenderness to palpation, but does demonstrate facial wincing. Pt continues to verbally deny pain throughout objective measures. Introduced question about continuing therapy, but pt reports she doesn't think she needs therapy, son reports he thinks pt needs it but he doesn't think pt wants it. Provided reassessment measurements demonstrating improvement in cervical AROM and maintaining strength. Son most concerned with pt's inability to relax, always so tense and has been for awhile and educated pt that increasing consistency of HEP to notice improvements; also, suggested yoga for relaxation breathing and mind-body connection via youtube since pt uses that viewing engine. Not all objective measures reassessed this date due to pt being satisfied with current functional level and no longer wanting physical therapy; will discharge from [facility] this date. Educated both pt and son to  retrieve new referral if needs arise in future.    Personal Factors and Comorbidities  Age;Fitness;Behavior Pattern;Comorbidity 3+;Education;Past/Current Experience;Social Background;Time since onset of injury/illness/exacerbation;Finances;Transportation    Comorbidities  hx of cervical spondylosis with myelopathy and radiculopathy, DM, HTN, ACDF 01/2019    Examination-Activity Limitations  Bed Mobility;Reach Overhead;Caring for Others;Sleep;Dressing;Hygiene/Grooming;Lift    Examination-Participation Restrictions  Church;Yard Work;Cleaning;Meal Prep;Community Activity;Shop    Stability/Clinical Decision Making  Evolving/Moderate complexity    Rehab Potential  Fair    PT Frequency  1x / week    PT Duration  3 weeks    PT Treatment/Interventions  ADLs/Self Care Home Management;Cryotherapy;Electrical Stimulation;Moist Heat;Ultrasound;Functional mobility training;Therapeutic activities;Therapeutic exercise;Balance training;Neuromuscular re-education;Patient/family education;Manual techniques;Passive range of motion;Dry needling;Taping    PT Next Visit Plan  d/c to HEP per pt request    PT Home Exercise Plan  Eval: cervical ROM all planes, desensitization progressive routine; 06/22/19:  improve awareness of posture, sitting tall; 9/9: rows    Consulted and Agree with Plan of Care  Patient;Family member/caregiver    Family Member Consulted  son       Patient will benefit from skilled therapeutic intervention in order to improve the following deficits and impairments:  Decreased range of motion, Increased fascial restricitons, Decreased safety awareness, Increased muscle spasms, Impaired UE functional use, Pain, Decreased balance, Decreased scar mobility, Hypomobility, Improper  body mechanics, Impaired flexibility, Decreased strength, Postural dysfunction  Visit Diagnosis: Cervicalgia  Muscle weakness (generalized)  Abnormal posture  Unsteadiness on feet     Problem List Patient Active Problem  List   Diagnosis Date Noted  . Cervical spondylosis with myelopathy and radiculopathy 02/09/2019  . Rectal bleeding 08/05/2017  . Diarrhea 08/05/2017  . Abnormal chest CT 08/05/2017  . Elevated blood pressure reading 01/26/2017  . Nausea with vomiting 10/03/2015  . GERD (gastroesophageal reflux disease) 04/14/2015     Talbot Grumbling PT, DPT 07/06/19, 12:06 PM [facility] [place], [place], [address] Phone: [phone]   Fax:  [phone]  Name: Jessica Kane MRN: [id number] Date of Birth: Feb 22, 1961

## 2019-07-13 ENCOUNTER — Ambulatory Visit (HOSPITAL_COMMUNITY): Payer: Medicaid Other | Admitting: Physical Therapy

## 2020-12-07 ENCOUNTER — Other Ambulatory Visit (HOSPITAL_COMMUNITY): Payer: Self-pay | Admitting: Family

## 2020-12-07 ENCOUNTER — Other Ambulatory Visit: Payer: Self-pay | Admitting: Family

## 2020-12-07 DIAGNOSIS — F17210 Nicotine dependence, cigarettes, uncomplicated: Secondary | ICD-10-CM

## 2021-01-03 ENCOUNTER — Ambulatory Visit (HOSPITAL_COMMUNITY)
Admission: RE | Admit: 2021-01-03 | Discharge: 2021-01-03 | Disposition: A | Discharge status: Home / Self Care | Payer: Medicaid Other | Source: Ambulatory Visit | Attending: Family | Admitting: Family

## 2021-01-03 ENCOUNTER — Other Ambulatory Visit: Payer: Self-pay

## 2021-01-03 DIAGNOSIS — F17210 Nicotine dependence, cigarettes, uncomplicated: Secondary | ICD-10-CM | POA: Diagnosis not present

## 2022-03-07 ENCOUNTER — Other Ambulatory Visit (HOSPITAL_COMMUNITY): Payer: Self-pay | Admitting: Family

## 2022-03-07 ENCOUNTER — Other Ambulatory Visit (HOSPITAL_BASED_OUTPATIENT_CLINIC_OR_DEPARTMENT_OTHER): Payer: Self-pay | Admitting: Family

## 2022-03-07 DIAGNOSIS — R918 Other nonspecific abnormal finding of lung field: Secondary | ICD-10-CM

## 2022-03-07 DIAGNOSIS — F17210 Nicotine dependence, cigarettes, uncomplicated: Secondary | ICD-10-CM

## 2022-04-16 ENCOUNTER — Encounter (HOSPITAL_COMMUNITY): Payer: Self-pay

## 2022-04-16 ENCOUNTER — Ambulatory Visit (HOSPITAL_COMMUNITY): Admission: RE | Admit: 2022-04-16 | Payer: Medicaid Other | Source: Ambulatory Visit

## 2022-11-18 IMAGING — CT CT CHEST LUNG CANCER SCREENING LOW DOSE W/O CM
2 of 3 series · 15 of 36 positions shown, 18 images · non-contrast
Comparison: None.

CLINICAL DATA: 59-year-old asymptomatic female current smoker with
32 pack-year smoking history.

EXAM:
CT CHEST WITHOUT CONTRAST LOW-DOSE FOR LUNG CANCER SCREENING
TECHNIQUE: Multidetector CT imaging of the chest was performed following the
standard protocol without IV contrast.

[Series 2: axial st · axial · 0.54mm/px · z∈[+1280,+1510]mm · 12 of 54 slices shown, 15 images]
[im 4/54  mediastinal]
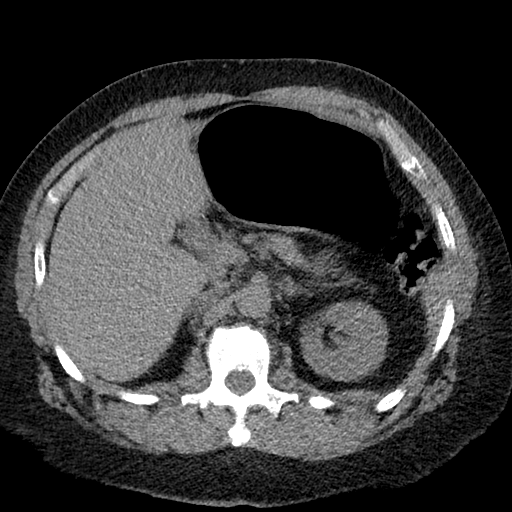
[im 4/54  lung]
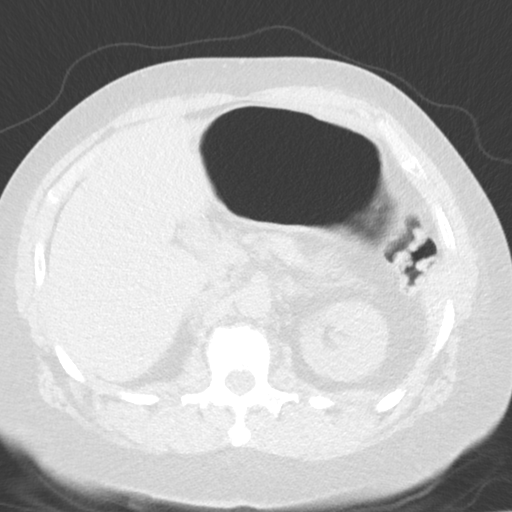
[im 8/54  lung]
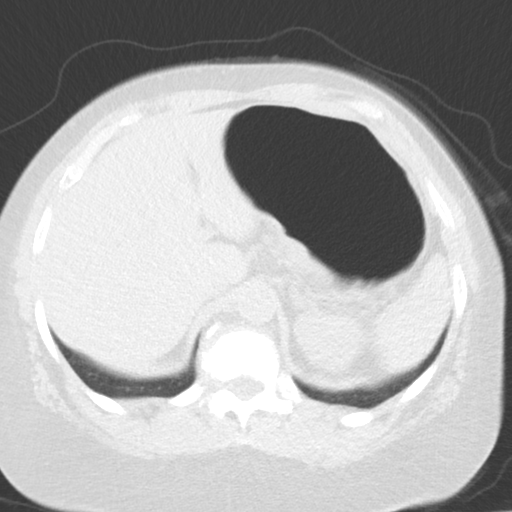
[im 12/54  lung]
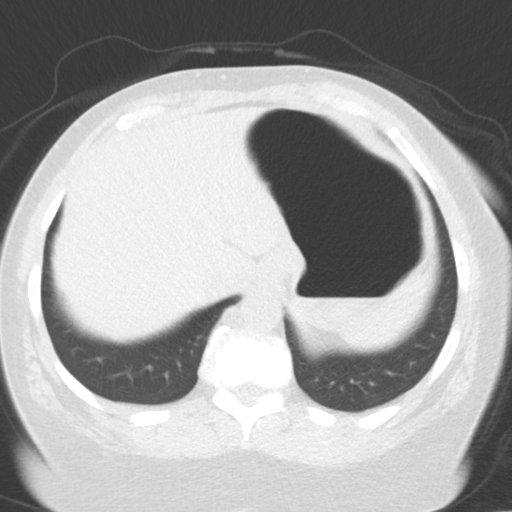
[im 16/54  lung]
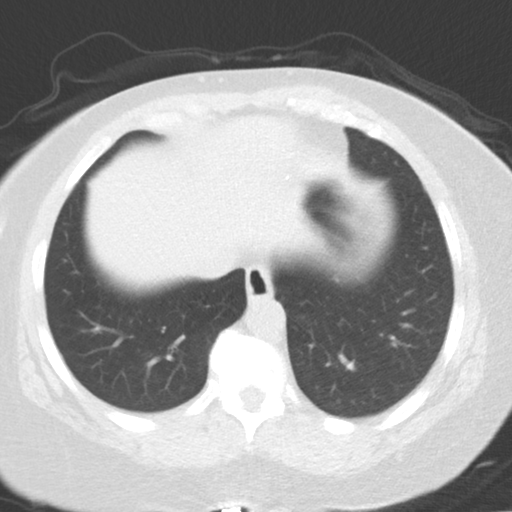
[im 20/54  mediastinal]
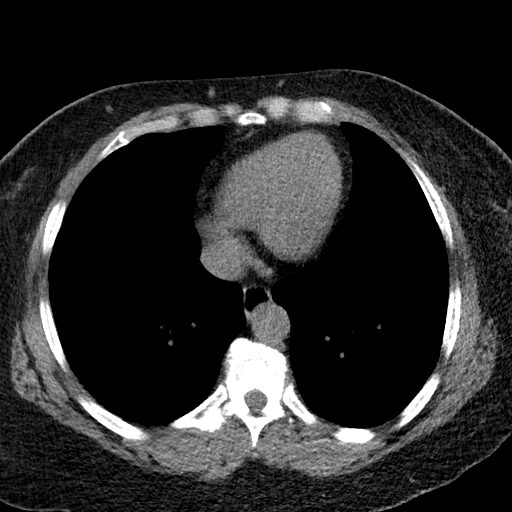
[im 20/54  lung]
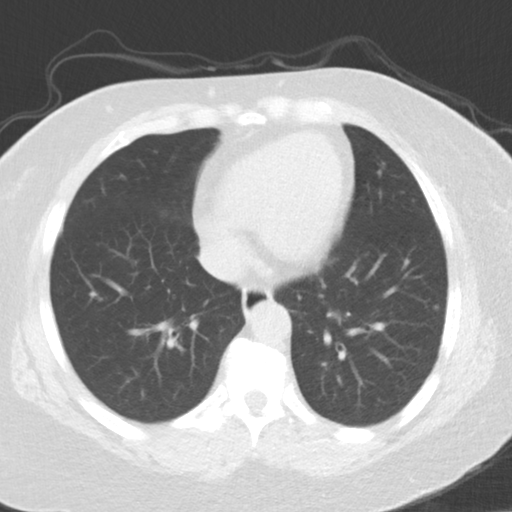
[im 24/54  lung]
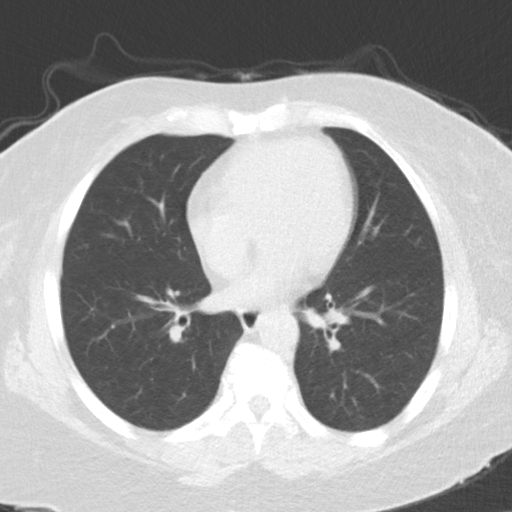
[im 30/54  lung]
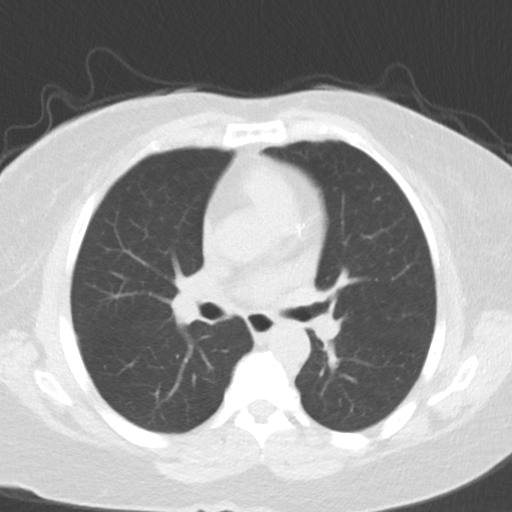
[im 34/54  lung]
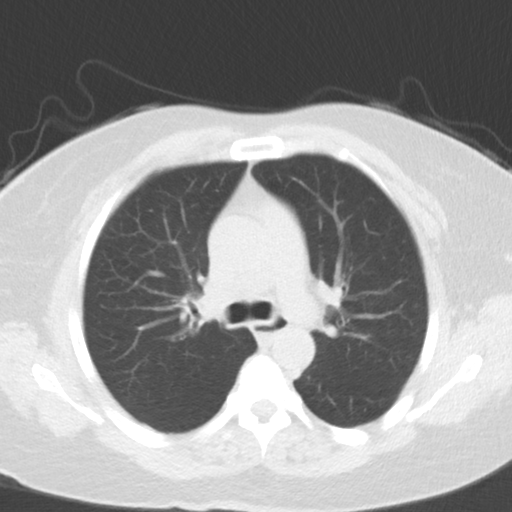
[im 38/54  mediastinal]
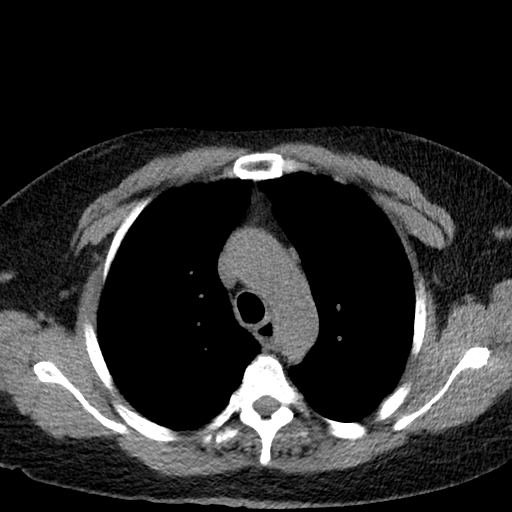
[im 38/54  lung]
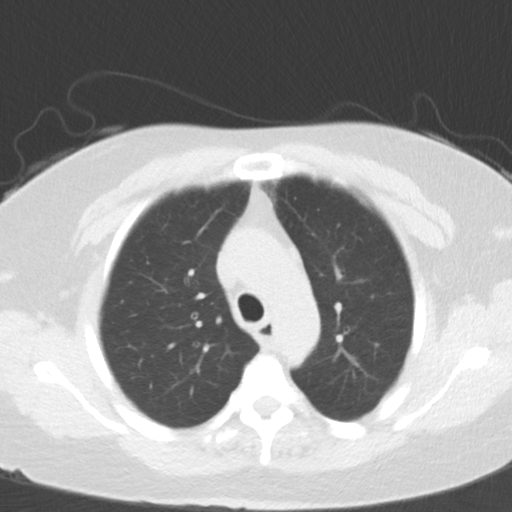
[im 42/54  lung]
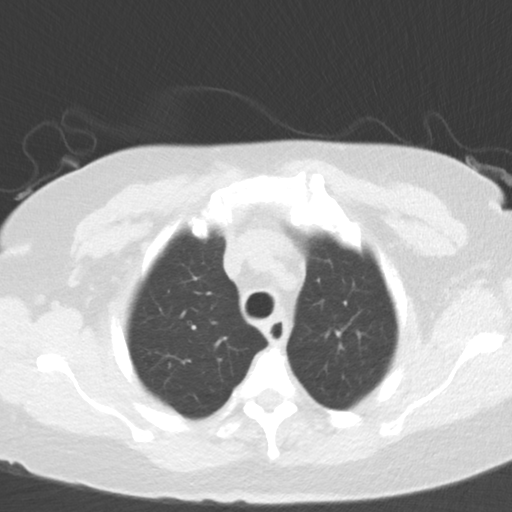
[im 46/54  lung]
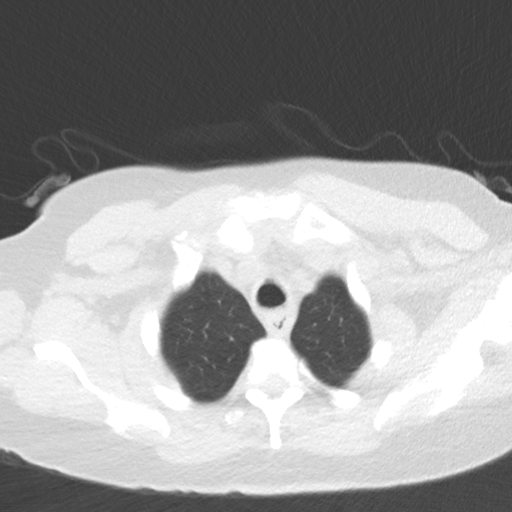
[im 50/54  lung]
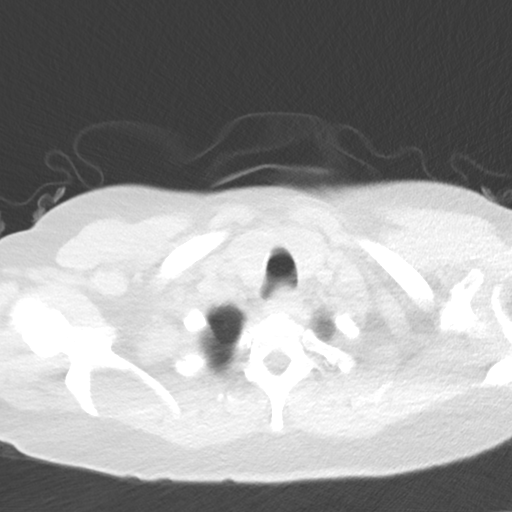

[Series 5: coronal · coronal · 0.55mm/px · 3 of 244 slices shown]
[im 49/244  lung]
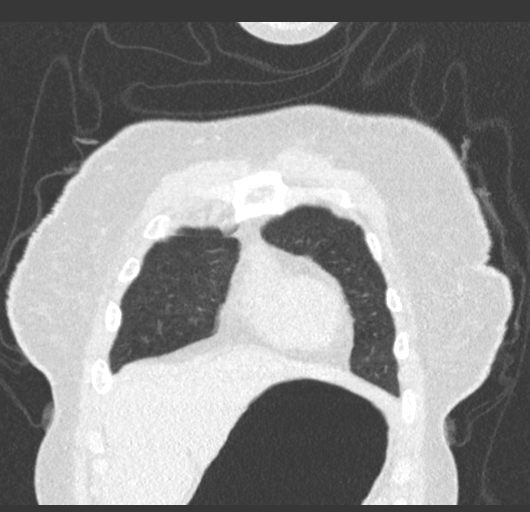
[im 98/244  lung]
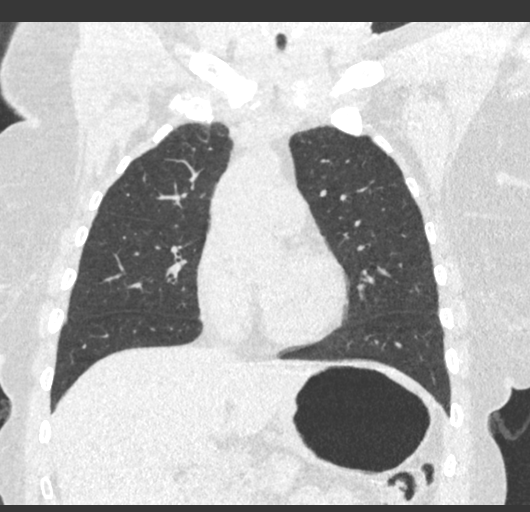
[im 146/244  lung]
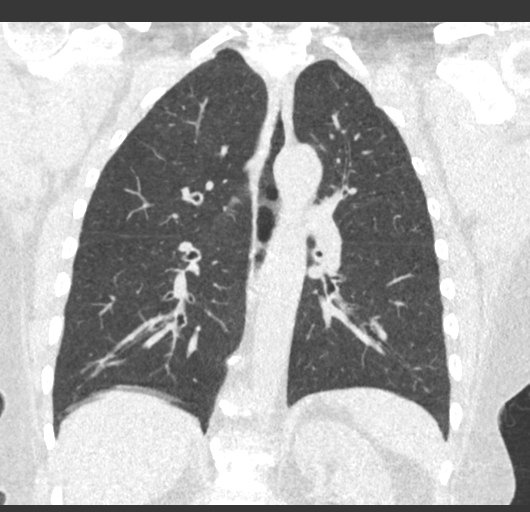

[15 of 36 positions shown; findings below may reference images not displayed]

FINDINGS: Cardiovascular: Normal heart size. No significant pericardial
effusion/thickening. Left anterior descending coronary
atherosclerosis. Minimally atherosclerotic nonaneurysmal thoracic
aorta. Normal caliber pulmonary arteries.

Mediastinum/Nodes: No discrete thyroid nodules. Unremarkable
esophagus. No pathologically enlarged axillary, mediastinal or hilar
lymph nodes, noting limited sensitivity for the detection of hilar
adenopathy on this noncontrast study.

Lungs/Pleura: No pneumothorax. No pleural effusion. No acute
consolidative airspace disease or lung masses. Mild centrilobular
emphysema with diffuse bronchial wall thickening. A few scattered
small pulmonary nodules in both lungs, largest 3.4 mm in volume
derived mean diameter in the peripheral left lower lobe (series
4/image 170).

Upper abdomen: No acute abnormality.

Musculoskeletal: No aggressive appearing focal osseous lesions.
Moderate thoracic spondylosis.
IMPRESSION: 1. Lung-RADS 2, benign appearance or behavior. Continue annual
screening with low-dose chest CT without contrast in 12 months.
2. One vessel coronary atherosclerosis.
3. Aortic Atherosclerosis ([id number]-[id number]) and Emphysema ([id number].K).

## 2023-01-14 ENCOUNTER — Ambulatory Visit (HOSPITAL_COMMUNITY)
Admission: RE | Admit: 2023-01-14 | Discharge: 2023-01-14 | Disposition: A | Discharge status: Home / Self Care | Payer: Medicaid Other | Source: Ambulatory Visit | Attending: Family | Admitting: Family

## 2023-01-14 DIAGNOSIS — R918 Other nonspecific abnormal finding of lung field: Secondary | ICD-10-CM | POA: Insufficient documentation

## 2023-01-14 DIAGNOSIS — F17210 Nicotine dependence, cigarettes, uncomplicated: Secondary | ICD-10-CM | POA: Insufficient documentation

## 2023-04-01 ENCOUNTER — Other Ambulatory Visit: Payer: Self-pay

## 2023-04-01 ENCOUNTER — Encounter: Payer: Self-pay | Admitting: Internal Medicine

## 2023-04-01 ENCOUNTER — Ambulatory Visit: Payer: Medicaid Other | Attending: Internal Medicine | Admitting: Internal Medicine

## 2023-04-01 VITALS — BP 132/84 | HR 99 | Ht 64.0 in | Wt 117.0 lb

## 2023-04-01 DIAGNOSIS — I2510 Atherosclerotic heart disease of native coronary artery without angina pectoris: Secondary | ICD-10-CM | POA: Insufficient documentation

## 2023-04-01 DIAGNOSIS — I10 Essential (primary) hypertension: Secondary | ICD-10-CM | POA: Diagnosis not present

## 2023-04-01 NOTE — Progress Notes (Signed)
Cardiology Office Note  Date: 04/01/2023   ID: [name], DOB [birth year], MRN [id number]  PCP:  [facility]  Cardiologist:  None Electrophysiologist:  None   Reason for Office Visit: Evaluation of coronary calcifications at the request of Joelin, NP   History of Present Illness: Jessica Kane is a 62 y.o. female known to have HTN, DM 2, GERD, asthma was referred to cardiology clinic for imaging evidence of coronary artery calcifications. Accompanied by son.  Patient is not much active at home. She does not do any household chores but she can do ADLs.  Denies any angina or DOE.  No dizziness, syncope, palpitations or leg swelling.  She underwent CT chest for lung cancer screening in 12/2022 that showed coronary artery calcifications and plaque in the LAD.  Past Medical History:  Diagnosis Date   Asthma    Essential hypertension    Gastritis and duodenitis    GERD (gastroesophageal reflux disease)    Internal hemorrhoids    Type 2 diabetes mellitus (HCC)     Past Surgical History:  Procedure Laterality Date   ANTERIOR CERVICAL DECOMP/DISCECTOMY FUSION N/A 02/09/2019   Procedure: Cervical four-five Anterior cervical decompression/discectomy/fusion/interbody prosthesis/plate/screws;  Surgeon: Tressie Stalker, MD;  Location: [facility] OR;  Service: Neurosurgery;  Laterality: N/A;   BIOPSY N/A 06/04/2015   Procedure: GASTRIC BIOPSIES;  Surgeon: West Bali, MD;  Location: [facility]S;  Service: Endoscopy;  Laterality: N/A;   CESAREAN SECTION     COLONOSCOPY  2007   Dr. Allena Katz: mild diverticulosis   COLONOSCOPY WITH PROPOFOL N/A 06/04/2015   Dr. Darrick Penna: 1. the left colon is redundant 2. Small internal hemorrhoids 3. The  colonici mucosa appeared normal> next colonscopy in 10 years   ESOPHAGOGASTRODUODENOSCOPY  2006   Dr. Allena Katz: mild to moderate diffuse gastritis, mild duodenitis   ESOPHAGOGASTRODUODENOSCOPY (EGD) WITH PROPOFOL N/A 06/04/2015   Dr. Darrick Penna: 1.  Dyspepsia due to MILD  gastritis and moderate duodenitis 2. Small hiatal hernia. Negative path for H.pylori    EYE SURGERY     cataract   MULTIPLE TOOTH EXTRACTIONS      Current Outpatient Medications  Medication Sig Dispense Refill   albuterol (PROVENTIL HFA;VENTOLIN HFA) 108 (90 BASE) MCG/ACT inhaler Inhale 2 puffs into the lungs every 4 (four) hours as needed for wheezing or shortness of breath.      amLODipine (NORVASC) 10 MG tablet Take 10 mg by mouth daily.     glimepiride (AMARYL) 1 MG tablet Take 1 mg by mouth daily with breakfast.     hydrochlorothiazide (MICROZIDE) 12.5 MG capsule Take 12.5 mg by mouth every morning.     metFORMIN (GLUCOPHAGE) 1000 MG tablet Take 1,000 mg by mouth 2 (two) times daily with a meal.      montelukast (SINGULAIR) 10 MG tablet Take 10 mg by mouth at bedtime.     oxyCODONE (OXY IR/ROXICODONE) 5 MG immediate release tablet Take 1 tablet (5 mg total) by mouth every 4 (four) hours as needed for moderate pain ((score 4 to 6)). 30 tablet 0   pantoprazole (PROTONIX) 40 MG tablet Take 1 tablet (40 mg total) by mouth 2 (two) times daily before a meal. 60 tablet 11   ranitidine (ZANTAC) 150 MG capsule 1 PO BID Monday  THRU FRIDAY (Patient taking differently: Take 150 mg by mouth 2 (two) times daily.) 60 capsule 11   Vitamin D, Ergocalciferol, (DRISDOL) 1.25 MG (50000 UNIT) CAPS capsule Take 50,000 Units by mouth once  a week.     cyclobenzaprine (FLEXERIL) 10 MG tablet Take 1 tablet (10 mg total) by mouth 3 (three) times daily as needed for muscle spasms. (Patient not taking: Reported on 04/01/2023) 50 tablet 0   dicyclomine (BENTYL) 10 MG capsule Take 1 capsule (10 mg total) by mouth 4 (four) times daily as needed for spasms. (Patient not taking: Reported on 01/19/2019) 90 capsule 1   docusate sodium (COLACE) 100 MG capsule Take 1 capsule (100 mg total) by mouth 2 (two) times daily. (Patient not taking: Reported on 04/01/2023) 60 capsule 0   fluticasone (FLOVENT HFA) 44  MCG/ACT inhaler Inhale 1 puff into the lungs 2 (two) times daily. (Patient not taking: Reported on 04/01/2023)     simethicone (GAS-X) 80 MG chewable tablet Chew 1 tablet (80 mg total) by mouth every 6 (six) hours as needed for flatulence. (Patient not taking: Reported on 01/19/2019) 30 tablet 0   No current facility-administered medications for this visit.   Allergies:  Patient has no known allergies.   Social History: The patient  reports that she has been smoking cigarettes. She has been smoking an average of 2 packs per day. She has never used smokeless tobacco. She reports that she does not drink alcohol and does not use drugs.   Family History: The patient's family history is not on file.   ROS:  Please see the history of present illness. Otherwise, complete review of systems is positive for none.  All other systems are reviewed and negative.   Physical Exam: VS:  BP 132/84   Pulse 99   Ht 5\' 4"  (1.626 m)   Wt 117 lb (53.1 kg)   SpO2 97%   BMI 20.08 kg/m , BMI Body mass index is 20.08 kg/m.  Wt Readings from Last 3 Encounters:  04/01/23 117 lb (53.1 kg)  02/02/19 135 lb 6.4 oz (61.4 kg)  10/07/17 138 lb 9.6 oz (62.9 kg)    General: Patient appears comfortable at rest. HEENT: Conjunctiva and lids normal, oropharynx clear with moist mucosa. Neck: Supple, no elevated JVP or carotid bruits, no thyromegaly. Lungs: Clear to auscultation, nonlabored breathing at rest. Cardiac: Regular rate and rhythm, no S3 or significant systolic murmur, no pericardial rub. Abdomen: Soft, nontender, no hepatomegaly, bowel sounds present, no guarding or rebound. Extremities: No pitting edema, distal pulses 2+. Skin: Warm and dry. Musculoskeletal: No kyphosis. Neuropsychiatric: Alert and oriented x3, affect grossly appropriate.  Recent Labwork: No results found for requested labs within last 365 days.  No results found for: "CHOL", "TRIG", "HDL", "CHOLHDL", "VLDL", "LDLCALC", "LDLDIRECT"  Other  Studies Reviewed Today:   Assessment and Plan: Patient is a 62 year old F known to have HTN, DM 2, GERD, asthma was referred to cardiology clinic for imaging evidence of coronary calcifications.  # Imaging evidence of coronary artery calcifications especially in the LAD (plaque in the LAD is present) -Patient has METs less than 4.  Can do ADLs but not household chores. EKG showed NSR and no ischemic changes.  Does not complain of any angina or DOE. Would not recommend any cardiac testing at this time. If she develops any ischemic symptoms in the future, will perform stress testing or LHC depending on severity of symptoms.  # HTN, controlled -Continue amlodipine 10 mg once daily and HCTZ 12.5 mg once daily.  HTN management per PCP.   I have spent a total of 30 minutes with patient reviewing chart, EKGs, labs and examining patient as well as establishing an assessment  and plan that was discussed with the patient.  > 50% of time was spent in direct patient care.    Medication Adjustments/Labs and Tests Ordered: Current medicines are reviewed at length with the patient today.  Concerns regarding medicines are outlined above.   Tests Ordered: No orders of the defined types were placed in this encounter.   Medication Changes: No orders of the defined types were placed in this encounter.   Disposition:  Follow up prn  Signed, Monteen Toops Verne Spurr, MD, 04/01/2023 3:52 PM    [facility] at [facility] [address] [place], [place] [address]

## 2023-04-01 NOTE — Patient Instructions (Signed)
Medication Instructions:  Your physician recommends that you continue on your current medications as directed. Please refer to the Current Medication list given to you today.  *If you need a refill on your cardiac medications before your next appointment, please call your pharmacy*   Lab Work: NONE   If you have labs (blood work) drawn today and your tests are completely normal, you will receive your results only by: MyChart Message (if you have MyChart) OR A paper copy in the mail If you have any lab test that is abnormal or we need to change your treatment, we will call you to review the results.   Testing/Procedures: NONE    Follow-Up: At [facility], you and your health needs are our priority.  As part of our continuing mission to provide you with exceptional heart care, we have created designated Provider Care Teams.  These Care Teams include your primary Cardiologist (physician) and Advanced Practice Providers (APPs -  Physician Assistants and Nurse Practitioners) who all work together to provide you with the care you need, when you need it.  We recommend signing up for the patient portal called "MyChart".  Sign up information is provided on this After Visit Summary.  MyChart is used to connect with patients for Virtual Visits (Telemedicine).  Patients are able to view lab/test results, encounter notes, upcoming appointments, etc.  Non-urgent messages can be sent to your provider as well.   To learn more about what you can do with MyChart, go to [email or web address].    Your next appointment:    As Needed   Provider:   Vishnu Mallipeddi, MD    Other Instructions Thank you for choosing [facility]!    

## 2023-04-02 NOTE — Addendum Note (Signed)
Addended by: Marlyn Corporal A on: 04/02/2023 10:52 AM   Modules accepted: Orders

## 2023-04-06 NOTE — Progress Notes (Signed)
Order(s) created erroneously. Erroneous order ID: [id number]  Order moved by: Leonia Corona  Order move date/time: 04/06/2023 4:54 PM  Source Patient: [id number]  Source Contact: 04/01/2023  Destination Patient: [id number]  Destination Contact: 04/01/2023

## 2024-03-16 ENCOUNTER — Other Ambulatory Visit (HOSPITAL_COMMUNITY): Payer: Self-pay | Admitting: Family

## 2024-03-16 DIAGNOSIS — Z87891 Personal history of nicotine dependence: Secondary | ICD-10-CM

## 2024-03-16 DIAGNOSIS — Z1231 Encounter for screening mammogram for malignant neoplasm of breast: Secondary | ICD-10-CM

## 2024-04-04 ENCOUNTER — Ambulatory Visit (HOSPITAL_COMMUNITY)
Admission: RE | Admit: 2024-04-04 | Discharge: 2024-04-04 | Disposition: A | Discharge status: Home / Self Care | Source: Ambulatory Visit | Attending: Family | Admitting: Family

## 2024-04-04 DIAGNOSIS — Z87891 Personal history of nicotine dependence: Secondary | ICD-10-CM | POA: Diagnosis present

## 2024-04-28 ENCOUNTER — Ambulatory Visit (HOSPITAL_COMMUNITY)
